# Patient Record
Sex: Female | Born: 1955 | Race: White | Hispanic: No | Marital: Married | Smoking: Never smoker
Health system: Southern US, Community
[De-identification: ages and names within clinical notes are randomized; demographics above are authoritative.]

## PROBLEM LIST (undated history)

## (undated) DIAGNOSIS — N6019 Diffuse cystic mastopathy of unspecified breast: Secondary | ICD-10-CM

## (undated) DIAGNOSIS — K5792 Diverticulitis of intestine, part unspecified, without perforation or abscess without bleeding: Secondary | ICD-10-CM

## (undated) DIAGNOSIS — Z1211 Encounter for screening for malignant neoplasm of colon: Secondary | ICD-10-CM

## (undated) DIAGNOSIS — K5900 Constipation, unspecified: Secondary | ICD-10-CM

## (undated) DIAGNOSIS — Z1239 Encounter for other screening for malignant neoplasm of breast: Secondary | ICD-10-CM

## (undated) HISTORY — DX: Diffuse cystic mastopathy of unspecified breast: N60.19

## (undated) HISTORY — PX: BREAST EXCISIONAL BIOPSY: SUR124

## (undated) HISTORY — DX: Encounter for other screening for malignant neoplasm of breast: Z12.39

## (undated) HISTORY — DX: Constipation, unspecified: K59.00

## (undated) HISTORY — DX: Encounter for screening for malignant neoplasm of colon: Z12.11

## (undated) HISTORY — DX: Diverticulitis of intestine, part unspecified, without perforation or abscess without bleeding: K57.92

---

## 2000-08-30 ENCOUNTER — Other Ambulatory Visit: Admission: RE | Admit: 2000-08-30 | Discharge: 2000-08-30 | Payer: Self-pay | Admitting: Family Medicine

## 2003-02-10 HISTORY — PX: TOE SURGERY: SHX1073

## 2004-04-16 ENCOUNTER — Ambulatory Visit: Payer: Self-pay | Admitting: General Surgery

## 2005-06-01 ENCOUNTER — Ambulatory Visit: Payer: Self-pay | Admitting: General Surgery

## 2005-06-15 ENCOUNTER — Ambulatory Visit: Payer: Self-pay | Admitting: General Surgery

## 2006-02-09 HISTORY — PX: BUNIONECTOMY: SHX129

## 2006-06-17 ENCOUNTER — Ambulatory Visit: Payer: Self-pay | Admitting: General Surgery

## 2007-02-10 HISTORY — PX: COLONOSCOPY: SHX174

## 2007-02-10 LAB — HM COLONOSCOPY

## 2007-04-29 ENCOUNTER — Ambulatory Visit: Payer: Self-pay | Admitting: Family Medicine

## 2007-06-20 ENCOUNTER — Ambulatory Visit: Payer: Self-pay | Admitting: General Surgery

## 2007-09-16 ENCOUNTER — Ambulatory Visit: Payer: Self-pay | Admitting: General Surgery

## 2008-06-25 ENCOUNTER — Ambulatory Visit: Payer: Self-pay | Admitting: General Surgery

## 2009-06-26 ENCOUNTER — Ambulatory Visit: Payer: Self-pay | Admitting: General Surgery

## 2010-02-09 DIAGNOSIS — N6019 Diffuse cystic mastopathy of unspecified breast: Secondary | ICD-10-CM

## 2010-02-09 HISTORY — DX: Diffuse cystic mastopathy of unspecified breast: N60.19

## 2010-06-30 ENCOUNTER — Ambulatory Visit: Payer: Self-pay | Admitting: General Surgery

## 2011-02-10 DIAGNOSIS — Z1211 Encounter for screening for malignant neoplasm of colon: Secondary | ICD-10-CM

## 2011-02-10 DIAGNOSIS — Z1239 Encounter for other screening for malignant neoplasm of breast: Secondary | ICD-10-CM

## 2011-02-10 HISTORY — DX: Encounter for screening for malignant neoplasm of colon: Z12.11

## 2011-02-10 HISTORY — DX: Encounter for other screening for malignant neoplasm of breast: Z12.39

## 2011-07-01 ENCOUNTER — Ambulatory Visit: Payer: Self-pay | Admitting: General Surgery

## 2011-07-09 ENCOUNTER — Ambulatory Visit: Payer: Self-pay | Admitting: General Surgery

## 2011-08-03 ENCOUNTER — Other Ambulatory Visit: Payer: Self-pay | Admitting: Neurosurgery

## 2011-08-03 DIAGNOSIS — M545 Low back pain: Secondary | ICD-10-CM

## 2011-08-17 ENCOUNTER — Ambulatory Visit
Admission: RE | Admit: 2011-08-17 | Discharge: 2011-08-17 | Disposition: A | Discharge status: Home / Self Care | Payer: BC Managed Care – PPO | Source: Ambulatory Visit | Attending: Neurosurgery | Admitting: Neurosurgery

## 2011-08-17 DIAGNOSIS — M545 Low back pain: Secondary | ICD-10-CM

## 2012-02-10 LAB — HM PAP SMEAR

## 2012-04-26 ENCOUNTER — Encounter: Payer: Self-pay | Admitting: *Deleted

## 2012-05-11 ENCOUNTER — Encounter: Payer: Self-pay | Admitting: General Surgery

## 2012-07-01 ENCOUNTER — Ambulatory Visit: Payer: Self-pay | Admitting: General Surgery

## 2012-07-06 ENCOUNTER — Encounter: Payer: Self-pay | Admitting: General Surgery

## 2012-07-18 ENCOUNTER — Encounter: Payer: Self-pay | Admitting: General Surgery

## 2012-07-18 ENCOUNTER — Ambulatory Visit (INDEPENDENT_AMBULATORY_CARE_PROVIDER_SITE_OTHER): Payer: BC Managed Care – PPO | Admitting: General Surgery

## 2012-07-18 VITALS — BP 122/72 | HR 76 | Resp 12 | Ht 65.0 in | Wt 127.0 lb

## 2012-07-18 DIAGNOSIS — N6019 Diffuse cystic mastopathy of unspecified breast: Secondary | ICD-10-CM

## 2012-07-18 DIAGNOSIS — Z1239 Encounter for other screening for malignant neoplasm of breast: Secondary | ICD-10-CM

## 2012-07-18 NOTE — Progress Notes (Signed)
Patient ID: Laurie Figueroa, female   DOB: [birth year], 56 y.o.   MRN: [id number]  No chief complaint on file.   HPI Laurie Figueroa is a 57 y.o. female here for breast f/u with screening mammogram - [facility] 5/23 Cat 2. Patient denies any breast issues. History of FCD with prior biopsies HPI  Past Medical History  Diagnosis Date  . Diffuse cystic mastopathy 2012  . Breast screening, unspecified 2013  . Special screening for malignant neoplasms, colon 2013    Past Surgical History  Procedure Laterality Date  . Colonoscopy  2009    Dr. Evette Cristal  . Bunionectomy  2008  . Toe surgery  2005    Family History  Problem Relation Age of Onset  . Cancer Mother     pancreatic cancer  . Cancer Maternal Aunt     breast cancer    Social History History  Substance Use Topics  . Smoking status: Never Smoker   . Smokeless tobacco: Never Used  . Alcohol Use: 0.0 oz/week     Comment: ocassionally    Allergies  Allergen Reactions  . Codeine Nausea Only  . Penicillins Rash  . Sulfa Antibiotics Rash    No current outpatient prescriptions on file.   No current facility-administered medications for this visit.    Review of Systems Review of Systems  Constitutional: Negative.   Respiratory: Negative.   Cardiovascular: Negative.     Blood pressure 122/72, pulse 76, resp. rate 12, height 5\' 5"  (1.651 m), weight 127 lb (57.607 kg).  Physical Exam Physical Exam  Constitutional: She is oriented to person, place, and time. She appears well-developed and well-nourished.  Eyes: Conjunctivae are normal. No scleral icterus.  Neck: Normal range of motion. Neck supple.  Cardiovascular: Normal rate and regular rhythm.   Pulmonary/Chest: Effort normal and breath sounds normal. Right breast exhibits no inverted nipple, no mass, no nipple discharge, no skin change and no tenderness. Left breast exhibits no inverted nipple, no mass, no nipple discharge, no skin change and no tenderness.  Abdominal:  Soft. Bowel sounds are normal.  Lymphadenopathy:    She has no cervical adenopathy.    She has no axillary adenopathy.  Neurological: She is alert and oriented to person, place, and time.    Data Reviewed Mammogram reviewed Bir[facility] 2  Assessment    Stable exam, hx of FCD    Plan    Return 1 year.  Screening mammogram        Laurie Figueroa 07/18/2012, 9:14 PM

## 2012-07-18 NOTE — Patient Instructions (Signed)
Return in one year, screening mammogram.

## 2013-01-11 ENCOUNTER — Ambulatory Visit: Payer: Self-pay | Admitting: Family Medicine

## 2013-01-30 ENCOUNTER — Encounter: Payer: Self-pay | Admitting: General Surgery

## 2013-01-30 ENCOUNTER — Ambulatory Visit (INDEPENDENT_AMBULATORY_CARE_PROVIDER_SITE_OTHER): Payer: BC Managed Care – PPO | Admitting: General Surgery

## 2013-01-30 VITALS — BP 120/68 | HR 70 | Resp 12 | Ht 65.0 in | Wt 131.0 lb

## 2013-01-30 DIAGNOSIS — M940 Chondrocostal junction syndrome [Tietze]: Secondary | ICD-10-CM

## 2013-01-30 NOTE — Progress Notes (Signed)
Patient ID: Laurie Figueroa, female   DOB: 05/26/1955, 57 y.o.   MRN: [id number]  Chief Complaint  Patient presents with  . Follow-up    BREAST PAIN    HPI Laurie Figueroa is a 57 y.o. female here today for evaluation left breast pain. She states this has been going on three weeks. She states the area is sore and tender.  Last mammogram was in May 2014. She states she had a chest x-ray on December 3 and blood work on December 4.   HPI  Past Medical History  Diagnosis Date  . Diffuse cystic mastopathy 2012  . Breast screening, unspecified 2013  . Special screening for malignant neoplasms, colon 2013    Past Surgical History  Procedure Laterality Date  . Colonoscopy  2009    Dr. Evette Cristal  . Bunionectomy  2008  . Toe surgery  2005    Family History  Problem Relation Age of Onset  . Cancer Mother     pancreatic cancer  . Cancer Maternal Aunt     breast cancer    Social History History  Substance Use Topics  . Smoking status: Never Smoker   . Smokeless tobacco: Never Used  . Alcohol Use: 0.0 oz/week     Comment: ocassionally    Allergies  Allergen Reactions  . Codeine Nausea Only  . Penicillins Rash  . Sulfa Antibiotics Rash    Current Outpatient Prescriptions  Medication Sig Dispense Refill  . omeprazole (PRILOSEC) 20 MG capsule Take 1 capsule by mouth daily.       No current facility-administered medications for this visit.    Review of Systems Review of Systems  Constitutional: Negative.   Respiratory: Negative.   Cardiovascular: Negative.     Blood pressure 120/68, pulse 70, resp. rate 12, height 5\' 5"  (1.651 m), weight 131 lb (59.421 kg).  Physical Exam Physical Exam  Constitutional: She is oriented to person, place, and time. She appears well-developed and well-nourished.  Pulmonary/Chest: Left breast exhibits tenderness ( located over the second costal cartilage). Left breast exhibits no inverted nipple, no mass, no nipple discharge and no skin  change.  Neurological: She is alert and oriented to person, place, and time.  Skin: Skin is warm and dry.    Data Reviewed none  Assessment    Possible costalchondritis    Plan   Patient is aware to use an antiinflammatory of choice (Advil or Aleve)   daily for a week.          Caliann Leckrone G 01/31/2013, 5:36 AM

## 2013-01-30 NOTE — Patient Instructions (Addendum)
The patient is aware to use an antiinflammatory of choice (Advil or Aleve)  2 daily for a week.

## 2013-01-31 ENCOUNTER — Encounter: Payer: Self-pay | Admitting: General Surgery

## 2013-01-31 DIAGNOSIS — D239 Other benign neoplasm of skin, unspecified: Secondary | ICD-10-CM

## 2013-01-31 HISTORY — DX: Other benign neoplasm of skin, unspecified: D23.9

## 2013-08-01 ENCOUNTER — Encounter: Payer: Self-pay | Admitting: General Surgery

## 2013-08-01 ENCOUNTER — Ambulatory Visit: Payer: Self-pay | Admitting: General Surgery

## 2013-08-07 ENCOUNTER — Ambulatory Visit: Payer: BC Managed Care – PPO | Admitting: General Surgery

## 2013-08-28 ENCOUNTER — Ambulatory Visit (INDEPENDENT_AMBULATORY_CARE_PROVIDER_SITE_OTHER): Payer: BC Managed Care – PPO | Admitting: General Surgery

## 2013-08-28 ENCOUNTER — Encounter: Payer: Self-pay | Admitting: General Surgery

## 2013-08-28 VITALS — BP 114/80 | HR 78 | Resp 14 | Ht 65.0 in | Wt 133.0 lb

## 2013-08-28 DIAGNOSIS — Z1239 Encounter for other screening for malignant neoplasm of breast: Secondary | ICD-10-CM

## 2013-08-28 NOTE — Progress Notes (Signed)
Patient ID: Laurie Figueroa, female   DOB: [birth year], 58 y.o.   MRN: [id number]  Chief Complaint  Patient presents with  . Follow-up    mammogram    HPI Laurie Figueroa is a 58 y.o. female who presents for a breast evaluation. The most recent mammogram was done on 07/31/13. Patient does perform regular self breast checks and gets regular mammograms done.  No complaints at this time.   HPI  Past Medical History  Diagnosis Date  . Diffuse cystic mastopathy 2012  . Breast screening, unspecified 2013  . Special screening for malignant neoplasms, colon 2013    Past Surgical History  Procedure Laterality Date  . Colonoscopy  2009    Dr. Jamal Collin  . Bunionectomy  2008  . Toe surgery  2005    Family History  Problem Relation Age of Onset  . Cancer Mother     pancreatic cancer  . Cancer Maternal Aunt     breast cancer    Social History History  Substance Use Topics  . Smoking status: Never Smoker   . Smokeless tobacco: Never Used  . Alcohol Use: 0.0 oz/week     Comment: ocassionally    Allergies  Allergen Reactions  . Codeine Nausea Only  . Penicillins Rash  . Sulfa Antibiotics Rash    No current outpatient prescriptions on file.   No current facility-administered medications for this visit.    Review of Systems Review of Systems  Constitutional: Negative.   Respiratory: Negative.   Cardiovascular: Negative.     Blood pressure 114/80, pulse 78, resp. rate 14, height 5\' 5"  (1.651 m), weight 133 lb (60.328 kg).  Physical Exam Physical Exam  Constitutional: She is oriented to person, place, and time. She appears well-developed and well-nourished.  Eyes: Conjunctivae are normal. No scleral icterus.  Neck: Neck supple. No thyromegaly present.  Cardiovascular: Normal rate, regular rhythm and normal heart sounds.   No murmur heard. Pulmonary/Chest: Effort normal and breath sounds normal. Right breast exhibits no inverted nipple, no mass, no nipple discharge, no  skin change and no tenderness. Left breast exhibits no inverted nipple, no mass, no nipple discharge, no skin change and no tenderness.  Abdominal: Soft. Normal appearance and bowel sounds are normal. There is no hepatosplenomegaly. There is no tenderness. No hernia.  Lymphadenopathy:    She has no cervical adenopathy.    She has no axillary adenopathy.  Neurological: She is alert and oriented to person, place, and time.  Skin: Skin is warm and dry.    Data Reviewed Mammogram reviewed and stable  Assessment    Stable breast exam. History of FCD. Remote FH of breast cancer.    Plan    Patient to return in 1 year with bilateral screening mammogram.        Junie Panning G 08/29/2013, 5:48 AM

## 2013-08-28 NOTE — Patient Instructions (Addendum)
Patient to return in 1 year with bilateral screening mammogram. Continue self breast exams. Call office for any new breast issues or concerns.  

## 2013-08-29 ENCOUNTER — Encounter: Payer: Self-pay | Admitting: General Surgery

## 2013-12-11 ENCOUNTER — Encounter: Payer: Self-pay | Admitting: General Surgery

## 2014-06-10 LAB — HM MAMMOGRAPHY

## 2014-06-20 ENCOUNTER — Other Ambulatory Visit: Payer: Self-pay

## 2014-06-20 DIAGNOSIS — Z1231 Encounter for screening mammogram for malignant neoplasm of breast: Secondary | ICD-10-CM

## 2014-08-03 ENCOUNTER — Ambulatory Visit
Admission: RE | Admit: 2014-08-03 | Discharge: 2014-08-03 | Disposition: A | Discharge status: Home / Self Care | Payer: BC Managed Care – PPO | Source: Ambulatory Visit | Attending: General Surgery | Admitting: General Surgery

## 2014-08-03 DIAGNOSIS — Z1231 Encounter for screening mammogram for malignant neoplasm of breast: Secondary | ICD-10-CM | POA: Insufficient documentation

## 2014-08-21 ENCOUNTER — Encounter: Payer: Self-pay | Admitting: General Surgery

## 2014-08-21 ENCOUNTER — Ambulatory Visit (INDEPENDENT_AMBULATORY_CARE_PROVIDER_SITE_OTHER): Payer: BC Managed Care – PPO | Admitting: General Surgery

## 2014-08-21 VITALS — BP 132/70 | HR 74 | Resp 16 | Ht 65.0 in | Wt 136.0 lb

## 2014-08-21 DIAGNOSIS — Z1239 Encounter for other screening for malignant neoplasm of breast: Secondary | ICD-10-CM | POA: Diagnosis not present

## 2014-08-21 NOTE — Patient Instructions (Addendum)
Patient will be asked to return to the office in one year with a bilateral screening mammogram.  Continue monthly self breast exam. Call for any changes or concerns.

## 2014-08-21 NOTE — Progress Notes (Signed)
Patient ID: Laurie Figueroa, female   DOB: 05-Feb-1956, 59 y.o.   MRN: [id number]  Chief Complaint  Patient presents with  . Follow-up    mammogram    HPI Laurie Figueroa is a 59 y.o. female who presents for a breast evaluation. The most recent mammogram was done on 08/03/14.  Patient does perform regular self breast checks and gets regular mammograms done.    HPI  Past Medical History  Diagnosis Date  . Diffuse cystic mastopathy 2012  . Breast screening, unspecified 2013  . Special screening for malignant neoplasms, colon 2013    Past Surgical History  Procedure Laterality Date  . Colonoscopy  2009    Dr. Jamal Collin  . Bunionectomy  2008  . Toe surgery  2005  . Breast biopsy Left     benign    Family History  Problem Relation Age of Onset  . Cancer Mother     pancreatic cancer  . Breast cancer Maternal Aunt     Social History History  Substance Use Topics  . Smoking status: Never Smoker   . Smokeless tobacco: Never Used  . Alcohol Use: 0.0 oz/week     Comment: ocassionally    Allergies  Allergen Reactions  . Codeine Nausea Only  . Penicillins Rash  . Sulfa Antibiotics Rash    No current outpatient prescriptions on file.   No current facility-administered medications for this visit.    Review of Systems Review of Systems  Constitutional: Negative.   Respiratory: Negative.   Cardiovascular: Negative.     Blood pressure 132/70, pulse 74, resp. rate 16, height 5\' 5"  (1.651 m), weight 136 lb (61.689 kg).  Physical Exam Physical Exam  Constitutional: She is oriented to person, place, and time. She appears well-developed and well-nourished.  Eyes: Conjunctivae are normal. No scleral icterus.  Neck: Neck supple.  Cardiovascular: Normal rate, regular rhythm and normal heart sounds.   Pulmonary/Chest: Effort normal and breath sounds normal. Right breast exhibits no inverted nipple, no mass, no nipple discharge, no skin change and no tenderness. Left breast  exhibits no inverted nipple, no mass, no nipple discharge, no skin change and no tenderness.  Abdominal: Soft. Normal appearance and bowel sounds are normal. There is no hepatomegaly. There is no tenderness.  Lymphadenopathy:    She has no cervical adenopathy.    She has no axillary adenopathy.  Neurological: She is alert and oriented to person, place, and time.  Skin: Skin is warm and dry.    Data Reviewed Mammogram reviewed and stable.   Assessment History of FCD. Remote FH of breast cancer.     Plan   She has not had a full physical nor a gyn eval in few yrs. Discussed with pt and shis agreeable to seeing a gynecologist.  Patient will be asked to return to the office in one year with a bilateral screening mammogram.     PCP:  Cranford Mon, [place] Breeding G 08/21/2014, 1:50 PM

## 2014-10-11 ENCOUNTER — Ambulatory Visit (INDEPENDENT_AMBULATORY_CARE_PROVIDER_SITE_OTHER): Payer: BC Managed Care – PPO | Admitting: Obstetrics and Gynecology

## 2014-10-11 ENCOUNTER — Encounter: Payer: Self-pay | Admitting: Obstetrics and Gynecology

## 2014-10-11 VITALS — BP 133/83 | HR 76 | Ht 65.0 in | Wt 133.3 lb

## 2014-10-11 DIAGNOSIS — Z8619 Personal history of other infectious and parasitic diseases: Secondary | ICD-10-CM | POA: Diagnosis not present

## 2014-10-11 DIAGNOSIS — Z1211 Encounter for screening for malignant neoplasm of colon: Secondary | ICD-10-CM | POA: Diagnosis not present

## 2014-10-11 DIAGNOSIS — K219 Gastro-esophageal reflux disease without esophagitis: Secondary | ICD-10-CM | POA: Insufficient documentation

## 2014-10-11 DIAGNOSIS — N952 Postmenopausal atrophic vaginitis: Secondary | ICD-10-CM | POA: Diagnosis not present

## 2014-10-11 DIAGNOSIS — Z01419 Encounter for gynecological examination (general) (routine) without abnormal findings: Secondary | ICD-10-CM | POA: Diagnosis not present

## 2014-10-11 DIAGNOSIS — Z780 Asymptomatic menopausal state: Secondary | ICD-10-CM | POA: Diagnosis not present

## 2014-10-11 NOTE — Patient Instructions (Signed)
1.  Pap smear completed. 2.  Mammograms to be done per Dr. Jamal Collin. 3.  Stool guaiac card testing ordered. 4.  Routine screening labs ordered 5.  Encouraged calcium and vitamin D supplementation daily along with weightbearing exercise. 6.  Return in 1 year.

## 2014-10-11 NOTE — Progress Notes (Signed)
Patient ID: Laurie Figueroa, female   DOB: May 05, 1955, 59 y.o.   MRN: [id number] ANNUAL PREVENTATIVE CARE GYN  ENCOUNTER NOTE  Subjective:       Laurie Figueroa is a 59 y.o. G37P3003 female here for a routine annual gynecologic exam.  Current complaints: 1.  none    Gynecologic History No LMP recorded. Patient is postmenopausal. Contraception: post menopausal status Last Pap: 2014 . Results were: normal Last mammogram: 06/2014. Results were: normal No history of abnormal Pap smears. No history of gynecologic surgery. No HRT use. No significant vasomotor symptoms. Mild vaginal atrophy symptoms.   Obstetric History OB History  Gravida Para Term Preterm AB SAB TAB Ectopic Multiple Living  3 3 3       3     # Outcome Date GA Lbr Len/2nd Weight Sex Delivery Anes PTL Lv  3 Term 1994   7 lb 1.8 oz (3.225 kg) F Vag-Spont     2 Term 1991   7 lb 2.3 oz (3.239 kg) F Vag-Spont     1 Term 1988   7 lb 6.4 oz (3.357 kg) M Vag-Spont       Obstetric Comments  Age at first pregnancy-31  Age at first menstruation-12  Age at last period-53    Past Medical History  Diagnosis Date  . Diffuse cystic mastopathy 2012  . Breast screening, unspecified 2013  . Special screening for malignant neoplasms, colon 2013  . Constipation     Past Surgical History  Procedure Laterality Date  . Colonoscopy  2009    Dr. Jamal Collin  . Bunionectomy  2008  . Toe surgery  2005  . Breast biopsy Left     benign    No current outpatient prescriptions on file prior to visit.   No current facility-administered medications on file prior to visit.    Allergies  Allergen Reactions  . Codeine Nausea Only  . Penicillins Rash  . Sulfa Antibiotics Rash    Social History   Social History  . Marital Status: Married    Spouse Name: N/A  . Number of Children: N/A  . Years of Education: N/A   Occupational History  . Not on file.   Social History Main Topics  . Smoking status: Never Smoker   . Smokeless  tobacco: Never Used  . Alcohol Use: 0.0 oz/week     Comment: ocassionally  . Drug Use: No  . Sexual Activity: Yes    Birth Control/ Protection: Post-menopausal   Other Topics Concern  . Not on file   Social History Narrative    Family History  Problem Relation Age of Onset  . Cancer Mother     pancreatic cancer  . Breast cancer Maternal Aunt   . Diabetes Brother   . Heart disease Maternal Grandmother   . Ovarian cancer Neg Hx   . Colon cancer Neg Hx     The following portions of the patient's history were reviewed and updated as appropriate: allergies, current medications, past family history, past medical history, past social history, past surgical history and problem list.  Review of Systems ROS Review of Systems - General ROS: negative for - chills, fatigue, fever, hot flashes, night sweats, weight gain or weight loss Psychological ROS: negative for - anxiety, decreased libido, depression, mood swings, physical abuse or sexual abuse Ophthalmic ROS: negative for - blurry vision, eye pain or loss of vision ENT ROS: negative for - headaches, hearing change, visual changes or vocal changes Allergy and Immunology  ROS: negative for - hives, itchy/watery eyes or seasonal allergies Hematological and Lymphatic ROS: negative for - bleeding problems, bruising, swollen lymph nodes or weight loss Endocrine ROS: negative for - galactorrhea, hair pattern changes, hot flashes, malaise/lethargy, mood swings, palpitations, polydipsia/polyuria, skin changes, temperature intolerance or unexpected weight changes Breast ROS: negative for - new or changing breast lumps or nipple discharge Respiratory ROS: negative for - cough or shortness of breath Cardiovascular ROS: negative for - chest pain, irregular heartbeat, palpitations or shortness of breath Gastrointestinal ROS: no abdominal pain, change in bowel habits, or black or bloody stools Genito-Urinary ROS: no dysuria, trouble voiding, or  hematuria Musculoskeletal ROS: negative for - joint pain or joint stiffness Neurological ROS: negative for - bowel and bladder control changes Dermatological ROS: negative for rash and skin lesion changes   Objective:   BP 133/83 mmHg  Pulse 76  Ht 5\' 5"  (1.651 m)  Wt 133 lb 4.8 oz (60.464 kg)  BMI 22.18 kg/m2 CONSTITUTIONAL: Well-developed, well-nourished female in no acute distress.  PSYCHIATRIC: Normal mood and affect. Normal behavior. Normal judgment and thought content. [place]: Alert and oriented to person, place, and time. Normal muscle tone coordination. No cranial nerve deficit noted. HENT:  Normocephalic, atraumatic, External right and left ear normal. Oropharynx is clear and moist EYES: Conjunctivae and EOM are normal. Pupils are equal, round, and reactive to light. No scleral icterus.  NECK: Normal range of motion, supple, no masses.  Normal thyroid.  SKIN: Skin is warm and dry. No rash noted. Not diaphoretic. No erythema. No pallor. CARDIOVASCULAR: Normal heart rate noted, regular rhythm, no murmur. RESPIRATORY: Clear to auscultation bilaterally. Effort and breath sounds normal, no problems with respiration noted. BREASTS: Symmetric in size. No masses, skin changes, nipple drainage, or lymphadenopathy. ABDOMEN: Soft, normal bowel sounds, no distention noted.  No tenderness, rebound or guarding.  BLADDER: Normal PELVIC:  External Genitalia: Normal  BUS: Normal  Vagina: Normal  Cervix: Normal  Uterus: Normal  Adnexa: Normal  RV: External Exam NormaI, No Rectal Masses and Normal Sphincter tone  MUSCULOSKELETAL: Normal range of motion. No tenderness.  No cyanosis, clubbing, or edema.  2+ distal pulses. LYMPHATIC: No Axillary, Supraclavicular, or Inguinal Adenopathy.    Assessment:   Annual gynecologic examination 59 y.o. Contraception: post menopausal status Normal BMI Menopause. Vaginal atrophy  Plan:  Pap: Pap Co Test Mammogram: Not Indicated Stool Guaiac  Testing:  Ordered Labs: lipid, vit d, a1c, fbs. tsh Routine preventative health maintenance measures emphasized: Exercise/Diet/Weight control, Tobacco Warnings and Alcohol/Substance use risks Lubricants for vaginal atrophy were discussed. Return to [place], [place] Brayton Mars, MD

## 2014-10-12 ENCOUNTER — Telehealth: Payer: Self-pay

## 2014-10-12 LAB — HEMOGLOBIN A1C
Est. average glucose Bld gHb Est-mCnc: 108 mg/dL
Hgb A1c MFr Bld: 5.4 % (ref 4.8–5.6)

## 2014-10-12 LAB — LIPID PANEL
CHOLESTEROL TOTAL: 210 mg/dL — AB (ref 100–199)
Chol/HDL Ratio: 2.1 ratio units (ref 0.0–4.4)
HDL: 99 mg/dL (ref >39)
LDL CALC: 101 mg/dL — AB (ref 0–99)
TRIGLYCERIDES: 50 mg/dL (ref 0–149)
VLDL Cholesterol Cal: 10 mg/dL (ref 5–40)

## 2014-10-12 LAB — VITAMIN D 25 HYDROXY (VIT D DEFICIENCY, FRACTURES): Vit D, 25-Hydroxy: 31.9 ng/mL (ref 30.0–100.0)

## 2014-10-12 LAB — GLUCOSE, RANDOM: GLUCOSE: 79 mg/dL (ref 65–99)

## 2014-10-12 LAB — TSH: TSH: 0.778 u[IU]/mL (ref 0.450–4.500)

## 2014-10-12 NOTE — Telephone Encounter (Signed)
-----   Message from Brayton Mars, MD sent at 10/12/2014  8:45 AM EDT ----- Please Notify - Labs normal Cholesterol panel borderline. Recheck in 1 year.

## 2014-10-12 NOTE — Telephone Encounter (Signed)
Pt notified that labs were normal except cholesterol panel borderline and recheck in 1 yr. Pt inquired about pap results and pt informed that those results take 7-10 days or maybe less and they are not back yet. Pt encouraged to eat healthy and exercise.

## 2014-10-16 LAB — PAP IG AND HPV HIGH-RISK: PAP SMEAR COMMENT: 0

## 2014-11-14 ENCOUNTER — Ambulatory Visit: Payer: BC Managed Care – PPO

## 2014-11-14 ENCOUNTER — Ambulatory Visit (INDEPENDENT_AMBULATORY_CARE_PROVIDER_SITE_OTHER): Payer: BC Managed Care – PPO

## 2014-11-14 ENCOUNTER — Encounter: Payer: Self-pay | Admitting: Podiatry

## 2014-11-14 ENCOUNTER — Ambulatory Visit (INDEPENDENT_AMBULATORY_CARE_PROVIDER_SITE_OTHER): Payer: BC Managed Care – PPO | Admitting: Podiatry

## 2014-11-14 VITALS — BP 141/84 | HR 73 | Resp 18

## 2014-11-14 DIAGNOSIS — R52 Pain, unspecified: Secondary | ICD-10-CM | POA: Diagnosis not present

## 2014-11-14 DIAGNOSIS — M722 Plantar fascial fibromatosis: Secondary | ICD-10-CM

## 2014-11-14 DIAGNOSIS — M2011 Hallux valgus (acquired), right foot: Secondary | ICD-10-CM

## 2014-11-14 DIAGNOSIS — M2012 Hallux valgus (acquired), left foot: Secondary | ICD-10-CM

## 2014-11-14 MED ORDER — METHYLPREDNISOLONE 4 MG PO TBPK: ORAL_TABLET | ORAL | Status: DC

## 2014-11-14 MED ORDER — MELOXICAM 15 MG PO TABS: 15.0000 mg | ORAL_TABLET | Freq: Every day | ORAL | Status: DC

## 2014-11-14 NOTE — Patient Instructions (Signed)
Pre-Operative Instructions  Congratulations, you have decided to take an important step to improving your quality of life.  You can be assured that the doctors of [facility] will be with you every step of the way.  1. Plan to be at the surgery center/hospital at least 1 (one) hour prior to your scheduled time unless otherwise directed by the surgical center/hospital staff.  You must have a responsible adult accompany you, remain during the surgery and drive you home.  Make sure you have directions to the surgical center/hospital and know how to get there on time. 2. For hospital based surgery you will need to obtain a history and physical form from your family physician within 1 month prior to the date of surgery- we will give you a form for you primary physician.  3. We make every effort to accommodate the date you request for surgery.  There are however, times where surgery dates or times have to be moved.  We will contact you as soon as possible if a change in schedule is required.   4. No Aspirin/Ibuprofen for one week before surgery.  If you are on aspirin, any non-steroidal anti-inflammatory medications (Mobic, Aleve, Ibuprofen) you should stop taking it 7 days prior to your surgery.  You make take Tylenol  For pain prior to surgery.  5. Medications- If you are taking daily heart and blood pressure medications, seizure, reflux, allergy, asthma, anxiety, pain or diabetes medications, make sure the surgery center/hospital is aware before the day of surgery so they may notify you which medications to take or avoid the day of surgery. 6. No food or drink after midnight the night before surgery unless directed otherwise by surgical center/hospital staff. 7. No alcoholic beverages 24 hours prior to surgery.  No smoking 24 hours prior to or 24 hours after surgery. 8. Wear loose pants or shorts- loose enough to fit over bandages, boots, and casts. 9. No slip on shoes, sneakers are best. 10. Bring  your boot with you to the surgery center/hospital.  Also bring crutches or a walker if your physician has prescribed it for you.  If you do not have this equipment, it will be provided for you after surgery. 11. If you have not been contracted by the surgery center/hospital by the day before your surgery, call to confirm the date and time of your surgery. 12. Leave-time from work may vary depending on the type of surgery you have.  Appropriate arrangements should be made prior to surgery with your employer. 13. Prescriptions will be provided immediately following surgery by your doctor.  Have these filled as soon as possible after surgery and take the medication as directed. 14. Remove nail polish on the operative foot. 15. Wash the night before surgery.  The night before surgery wash the foot and leg well with the antibacterial soap provided and water paying special attention to beneath the toenails and in between the toes.  Rinse thoroughly with water and dry well with a towel.  Perform this wash unless told not to do so by your physician.  Enclosed: 1 Ice pack (please put in freezer the night before surgery)   1 Hibiclens skin cleaner   Pre-op Instructions  If you have any questions regarding the instructions, do not hesitate to call our office.  [place]: [address] [place], [place] [address] [phone]  [place]: [address] [place], [place] [address] [phone]  [place]: [address]  [place], [place] [address] [phone]  Dr. Richard   Tuchman DPM, Dr. Ila Mcgill DPM Dr. Harriet Masson DPM, Dr. Lanelle Bal DPM, Dr. Trudie Buckler DPM   Plantar Fasciitis (Heel Spur Syndrome) with Rehab The plantar fascia is a fibrous, ligament-like, soft-tissue structure that spans the bottom of the foot. Plantar fasciitis is a condition that causes pain in the foot due to inflammation of the tissue. SYMPTOMS  16. Pain and tenderness on the underneath side of the foot. 17. Pain that  worsens with standing or walking. CAUSES  Plantar fasciitis is caused by irritation and injury to the plantar fascia on the underneath side of the foot. Common mechanisms of injury include:  Direct trauma to bottom of the foot.  Damage to a small nerve that runs under the foot where the main fascia attaches to the heel bone.  Stress placed on the plantar fascia due to bone spurs. RISK INCREASES WITH:   Activities that place stress on the plantar fascia (running, jumping, pivoting, or cutting).  Poor strength and flexibility.  Improperly fitted shoes.  Tight calf muscles.  Flat feet.  Failure to warm-up properly before activity.  Obesity. PREVENTION  Warm up and stretch properly before activity.  Allow for adequate recovery between workouts.  Maintain physical fitness:  Strength, flexibility, and endurance.  Cardiovascular fitness.  Maintain a health body weight.  Avoid stress on the plantar fascia.  Wear properly fitted shoes, including arch supports for individuals who have flat feet.  PROGNOSIS  If treated properly, then the symptoms of plantar fasciitis usually resolve without surgery. However, occasionally surgery is necessary.  RELATED COMPLICATIONS   Recurrent symptoms that may result in a chronic condition.  Problems of the lower back that are caused by compensating for the injury, such as limping.  Pain or weakness of the foot during push-off following surgery.  Chronic inflammation, scarring, and partial or complete fascia tear, occurring more often from repeated injections.  TREATMENT  Treatment initially involves the use of ice and medication to help reduce pain and inflammation. The use of strengthening and stretching exercises may help reduce pain with activity, especially stretches of the Achilles tendon. These exercises may be performed at home or with a therapist. Your caregiver may recommend that you use heel cups of arch supports to help  reduce stress on the plantar fascia. Occasionally, corticosteroid injections are given to reduce inflammation. If symptoms persist for greater than 6 months despite non-surgical (conservative), then surgery may be recommended.   MEDICATION   If pain medication is necessary, then nonsteroidal anti-inflammatory medications, such as aspirin and ibuprofen, or other minor pain relievers, such as acetaminophen, are often recommended.  Do not take pain medication within 7 days before surgery.  Prescription pain relievers may be given if deemed necessary by your caregiver. Use only as directed and only as much as you need.  Corticosteroid injections may be given by your caregiver. These injections should be reserved for the most serious cases, because they may only be given a certain number of times.  HEAT AND COLD  Cold treatment (icing) relieves pain and reduces inflammation. Cold treatment should be applied for 10 to 15 minutes every 2 to 3 hours for inflammation and pain and immediately after any activity that aggravates your symptoms. Use ice packs or massage the area with a piece of ice (ice massage).  Heat treatment may be used prior to performing the stretching and strengthening activities prescribed by your caregiver, physical therapist, or athletic trainer. Use a heat pack or soak the injury in  warm water.  SEEK IMMEDIATE MEDICAL CARE IF:  Treatment seems to offer no benefit, or the condition worsens.  Any medications produce adverse side effects.  EXERCISES- RANGE OF MOTION (ROM) AND STRETCHING EXERCISES - Plantar Fasciitis (Heel Spur Syndrome) These exercises may help you when beginning to rehabilitate your injury. Your symptoms may resolve with or without further involvement from your physician, physical therapist or athletic trainer. While completing these exercises, remember:   Restoring tissue flexibility helps normal motion to return to the joints. This allows healthier, less  painful movement and activity.  An effective stretch should be held for at least 30 seconds.  A stretch should never be painful. You should only feel a gentle lengthening or release in the stretched tissue.  RANGE OF MOTION - Toe Extension, Flexion  Sit with your right / left leg crossed over your opposite knee.  Grasp your toes and gently pull them back toward the top of your foot. You should feel a stretch on the bottom of your toes and/or foot.  Hold this stretch for 10 seconds.  Now, gently pull your toes toward the bottom of your foot. You should feel a stretch on the top of your toes and or foot.  Hold this stretch for 10 seconds. Repeat  times. Complete this stretch 3 times per day.   RANGE OF MOTION - Ankle Dorsiflexion, Active Assisted  Remove shoes and sit on a chair that is preferably not on a carpeted surface.  Place right / left foot under knee. Extend your opposite leg for support.  Keeping your heel down, slide your right / left foot back toward the chair until you feel a stretch at your ankle or calf. If you do not feel a stretch, slide your bottom forward to the edge of the chair, while still keeping your heel down.  Hold this stretch for 10 seconds. Repeat 3 times. Complete this stretch 2 times per day.   STRETCH  Gastroc, Standing  Place hands on wall.  Extend right / left leg, keeping the front knee somewhat bent.  Slightly point your toes inward on your back foot.  Keeping your right / left heel on the floor and your knee straight, shift your weight toward the wall, not allowing your back to arch.  You should feel a gentle stretch in the right / left calf. Hold this position for 10 seconds. Repeat 3 times. Complete this stretch 2 times per day.  STRETCH  Soleus, Standing  Place hands on wall.  Extend right / left leg, keeping the other knee somewhat bent.  Slightly point your toes inward on your back foot.  Keep your right / left heel on the  floor, bend your back knee, and slightly shift your weight over the back leg so that you feel a gentle stretch deep in your back calf.  Hold this position for 10 seconds. Repeat 3 times. Complete this stretch 2 times per day.  STRETCH  Gastrocsoleus, Standing  Note: This exercise can place a lot of stress on your foot and ankle. Please complete this exercise only if specifically instructed by your caregiver.   Place the ball of your right / left foot on a step, keeping your other foot firmly on the same step.  Hold on to the wall or a rail for balance.  Slowly lift your other foot, allowing your body weight to press your heel down over the edge of the step.  You should feel a stretch in your right /  left calf.  Hold this position for 10 seconds.  Repeat this exercise with a slight bend in your right / left knee. Repeat 3 times. Complete this stretch 2 times per day.   STRENGTHENING EXERCISES - Plantar Fasciitis (Heel Spur Syndrome)  These exercises may help you when beginning to rehabilitate your injury. They may resolve your symptoms with or without further involvement from your physician, physical therapist or athletic trainer. While completing these exercises, remember:   Muscles can gain both the endurance and the strength needed for everyday activities through controlled exercises.  Complete these exercises as instructed by your physician, physical therapist or athletic trainer. Progress the resistance and repetitions only as guided.  STRENGTH - Towel Curls  Sit in a chair positioned on a non-carpeted surface.  Place your foot on a towel, keeping your heel on the floor.  Pull the towel toward your heel by only curling your toes. Keep your heel on the floor. Repeat 3 times. Complete this exercise 2 times per day.  STRENGTH - Ankle Inversion  Secure one end of a rubber exercise band/tubing to a fixed object (table, pole). Loop the other end around your foot just before your  toes.  Place your fists between your knees. This will focus your strengthening at your ankle.  Slowly, pull your big toe up and in, making sure the band/tubing is positioned to resist the entire motion.  Hold this position for 10 seconds.  Have your muscles resist the band/tubing as it slowly pulls your foot back to the starting position. Repeat 3 times. Complete this exercises 2 times per day.  Document Released: 01/26/2005 Document Revised: 04/20/2011 Document Reviewed: 05/10/2008 [facility] Patient Information 2014 [place], [place].

## 2014-11-14 NOTE — Progress Notes (Signed)
She presents today with chief complaint of pain to her right heel 3 weeks. She says nothing that she has done seems to help alleviate those symptoms. She is concerned that her bunion deformity is leading to this heel pain. She also states that she has some tenderness overlying the first metatarsal of the left foot where a previous bunion was performed thinks it is the K wires. She states it bothers her with shoe gear. She also complains of the bunion to the right foot being painful with shoe gear and starts to limit her daily activities. She's done nothing to try to relieve her symptoms.  Objective: I have reviewed her past mental history medications allergy surgeries and social history. 59 year old female in no apparent distress vital signs stable alert and oriented 3 strong palpable pulses bilateral. Neurologic sensorium is intact percent lasting monofilament. Deep tendon reflexes are intact bilateral and muscle strength +5 over 5 dorsiflexors plantar flexors inverters and everters all in his musculature is intact. Orthopedic evaluation does demonstrate hallux abductovalgus deformity of the right foot with full range of motion and crepitation and no pain on range of motion. She does have a reducible first intermetatarsal angle which would be good for surgical consideration. She also has a slight contraction of the second digit at the level of the metatarsophalangeal joint right foot. She has pain on palpation medial calcaneal tubercle of the right heel with no pain on medial and lateral compression of the calcaneus. She also has a palpable K wire to the dorsal and dorsomedial aspect of the first metatarsal left foot. 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 Contracture and near overlapping of the second digit at the metatarsophalangeal joint  is present. She also has a small plantar distally oriented calcaneal heel spur to the right heel with soft-tissue increase in density at the plantar fascial calcaneal insertion site. Left foot does demonstrate 2 retained K wires with a rectus first metatarsophalangeal joint. Cutaneous evaluation demonstrates supple well-hydrated cutis no erythema edema saline as drainage or odor.  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 Contracted second metatarsophalangeal joint of the right foot. Plantar fasciitis right foot. Painfully retained K wires first metatarsal left foot.  Plan: We discussed the etiology pathology conservative versus surgical therapies. At this point I injected her right heel today with Kenalog and local and aesthetic placed her plantar fascial strap and dispensed a plantar fascial night splint. I also dispensed a prescription for Medrol Dosepak to be followed by meloxicam. We also consented her today for surgical intervention consisting of an [facility] bunion repair of the right foot with screw fixation. I also consented her for endoscopic plantar fasciotomy of the right foot. We also consented her for removal of internal fixation/K wires first metatarsal left foot. We discussed all of these in great detail today. We discussed the pros and cons of all these procedures and also discussed possible complications which may include but are not limited to postop pain bleeding swelling infection recurrence need for further surgery over correction under correction loss of digit loss of limb and loss of life. She signed all 3 pages of the consent form I will follow-up with her in the near future for surgical intervention.  Laurie Figueroa DPM

## 2014-12-13 ENCOUNTER — Telehealth: Payer: Self-pay | Admitting: *Deleted

## 2014-12-13 NOTE — Telephone Encounter (Signed)
"  Patient called and canceled surgery.  Said she would call back later to reschedule."  Did she say why?  "No, she didn't."

## 2014-12-26 ENCOUNTER — Encounter: Payer: Self-pay | Admitting: Podiatry

## 2015-01-01 ENCOUNTER — Encounter: Payer: Self-pay | Admitting: Podiatry

## 2015-02-10 HISTORY — PX: FOOT SURGERY: SHX648

## 2015-05-02 ENCOUNTER — Ambulatory Visit (INDEPENDENT_AMBULATORY_CARE_PROVIDER_SITE_OTHER): Payer: BC Managed Care – PPO | Admitting: Podiatry

## 2015-05-02 DIAGNOSIS — M7751 Other enthesopathy of right foot: Secondary | ICD-10-CM | POA: Diagnosis not present

## 2015-05-02 DIAGNOSIS — M2011 Hallux valgus (acquired), right foot: Secondary | ICD-10-CM | POA: Diagnosis not present

## 2015-05-02 DIAGNOSIS — M722 Plantar fascial fibromatosis: Secondary | ICD-10-CM | POA: Diagnosis not present

## 2015-05-02 DIAGNOSIS — M779 Enthesopathy, unspecified: Principal | ICD-10-CM

## 2015-05-02 DIAGNOSIS — M778 Other enthesopathies, not elsewhere classified: Secondary | ICD-10-CM

## 2015-05-02 NOTE — Patient Instructions (Signed)
Pre-Operative Instructions  Congratulations, you have decided to take an important step to improving your quality of life.  You can be assured that the doctors of [facility] will be with you every step of the way.  1. Plan to be at the surgery center/hospital at least 1 (one) hour prior to your scheduled time unless otherwise directed by the surgical center/hospital staff.  You must have a responsible adult accompany you, remain during the surgery and drive you home.  Make sure you have directions to the surgical center/hospital and know how to get there on time. 2. For hospital based surgery you will need to obtain a history and physical form from your family physician within 1 month prior to the date of surgery- we will give you a form for you primary physician.  3. We make every effort to accommodate the date you request for surgery.  There are however, times where surgery dates or times have to be moved.  We will contact you as soon as possible if a change in schedule is required.   4. No Aspirin/Ibuprofen for one week before surgery.  If you are on aspirin, any non-steroidal anti-inflammatory medications (Mobic, Aleve, Ibuprofen) you should stop taking it 7 days prior to your surgery.  You make take Tylenol  For pain prior to surgery.  5. Medications- If you are taking daily heart and blood pressure medications, seizure, reflux, allergy, asthma, anxiety, pain or diabetes medications, make sure the surgery center/hospital is aware before the day of surgery so they may notify you which medications to take or avoid the day of surgery. 6. No food or drink after midnight the night before surgery unless directed otherwise by surgical center/hospital staff. 7. No alcoholic beverages 24 hours prior to surgery.  No smoking 24 hours prior to or 24 hours after surgery. 8. Wear loose pants or shorts- loose enough to fit over bandages, boots, and casts. 9. No slip on shoes, sneakers are best. 10. Bring  your boot with you to the surgery center/hospital.  Also bring crutches or a walker if your physician has prescribed it for you.  If you do not have this equipment, it will be provided for you after surgery. 11. If you have not been contracted by the surgery center/hospital by the day before your surgery, call to confirm the date and time of your surgery. 12. Leave-time from work may vary depending on the type of surgery you have.  Appropriate arrangements should be made prior to surgery with your employer. 13. Prescriptions will be provided immediately following surgery by your doctor.  Have these filled as soon as possible after surgery and take the medication as directed. 14. Remove nail polish on the operative foot. 15. Wash the night before surgery.  The night before surgery wash the foot and leg well with the antibacterial soap provided and water paying special attention to beneath the toenails and in between the toes.  Rinse thoroughly with water and dry well with a towel.  Perform this wash unless told not to do so by your physician.  Enclosed: 1 Ice pack (please put in freezer the night before surgery)   1 Hibiclens skin cleaner   Pre-op Instructions  If you have any questions regarding the instructions, do not hesitate to call our office.  [place]: [address] , [place] [address] [phone]  [place]: [address] [place], [place] [address] [phone]  [place]: [address]  [place], [place] [address] [phone]  Dr. Richard   Tuchman DPM, Dr. Norman Regal DPM Dr. Richard Sikora DPM, Dr. M. Todd Hyatt DPM, Dr. Kathryn Egerton DPM 

## 2015-05-03 NOTE — Progress Notes (Signed)
She presents today to re-side her consent regarding her bilateral foot surgery. She also states that she is having some such severe pain to the plantar aspect of the second metatarsophalangeal joint she just needs something to tide her over until she can have surgery in May.  Objective: Vital signs are stable she is alert and oriented 3, pulses are strongly palpable. She has great range of motion of the first metatarsophalangeal joint she has pain on palpation and in range of motion of the second metatarsophalangeal joint with mild hammertoe deformity. We also discussed removing the K wires from the first metatarsal of the left foot.  Assessment: Hallux abductovalgus deformity right plantar flex elongated second metatarsal right foot with hammertoe deformity current capsulitis.  Plan: I injected around the second metatarsophalangeal joint today with Kenalog and local anesthetic and she re-signed her consent forms and we added second metatarsal osteotomy and hammertoe repair which was discussed thoroughly today. I will follow-up with her in May for surgical correction.

## 2015-05-14 ENCOUNTER — Other Ambulatory Visit: Payer: Self-pay | Admitting: *Deleted

## 2015-05-14 DIAGNOSIS — Z1231 Encounter for screening mammogram for malignant neoplasm of breast: Secondary | ICD-10-CM

## 2015-05-16 ENCOUNTER — Encounter: Payer: Self-pay | Admitting: *Deleted

## 2015-07-04 ENCOUNTER — Other Ambulatory Visit: Payer: Self-pay | Admitting: Podiatry

## 2015-07-04 MED ORDER — CLINDAMYCIN HCL 150 MG PO CAPS: 150.0000 mg | ORAL_CAPSULE | Freq: Three times a day (TID) | ORAL | Status: DC

## 2015-07-04 MED ORDER — ONDANSETRON HCL 4 MG PO TABS: 4.0000 mg | ORAL_TABLET | Freq: Three times a day (TID) | ORAL | Status: DC | PRN

## 2015-07-04 MED ORDER — MEPERIDINE HCL 50 MG PO TABS: ORAL_TABLET | ORAL | Status: DC

## 2015-07-05 ENCOUNTER — Encounter: Payer: Self-pay | Admitting: Podiatry

## 2015-07-05 DIAGNOSIS — Z4889 Encounter for other specified surgical aftercare: Secondary | ICD-10-CM | POA: Diagnosis not present

## 2015-07-05 DIAGNOSIS — D492 Neoplasm of unspecified behavior of bone, soft tissue, and skin: Secondary | ICD-10-CM | POA: Diagnosis not present

## 2015-07-05 DIAGNOSIS — M2011 Hallux valgus (acquired), right foot: Secondary | ICD-10-CM | POA: Diagnosis not present

## 2015-07-05 DIAGNOSIS — M21541 Acquired clubfoot, right foot: Secondary | ICD-10-CM | POA: Diagnosis not present

## 2015-07-10 ENCOUNTER — Ambulatory Visit (INDEPENDENT_AMBULATORY_CARE_PROVIDER_SITE_OTHER): Payer: BC Managed Care – PPO

## 2015-07-10 ENCOUNTER — Encounter: Payer: Self-pay | Admitting: Podiatry

## 2015-07-10 ENCOUNTER — Telehealth: Payer: Self-pay | Admitting: *Deleted

## 2015-07-10 ENCOUNTER — Ambulatory Visit (INDEPENDENT_AMBULATORY_CARE_PROVIDER_SITE_OTHER): Payer: BC Managed Care – PPO | Admitting: Podiatry

## 2015-07-10 VITALS — BP 126/80 | HR 92 | Resp 16

## 2015-07-10 DIAGNOSIS — T847XXD Infection and inflammatory reaction due to other internal orthopedic prosthetic devices, implants and grafts, subsequent encounter: Secondary | ICD-10-CM

## 2015-07-10 DIAGNOSIS — M722 Plantar fascial fibromatosis: Secondary | ICD-10-CM | POA: Diagnosis not present

## 2015-07-10 DIAGNOSIS — Z9889 Other specified postprocedural states: Secondary | ICD-10-CM

## 2015-07-10 DIAGNOSIS — M2011 Hallux valgus (acquired), right foot: Secondary | ICD-10-CM

## 2015-07-10 NOTE — Telephone Encounter (Addendum)
Post op courtesy call-Left message on pt's home line, I would try to reach her on her mobile line.  I spoke with pt, she states she is doing well after her 1st POV today with Dr. Milinda Pointer.  I asked pt if she was doing okay with the boot and if she had any questions. Pt states she is fine and will call with concerns.

## 2015-07-10 NOTE — Progress Notes (Signed)
She presents today 6 days status post [facility] bunion repair right foot metatarsal osteotomy right foot and a endoscopic plantar fasciotomy right foot we also removed painful internal fixation first metatarsal left foot. She states that the left foot is fine the right foot is been painful. She is trying not to utilize for Demerol instead she is taking ibuprofen.  Objective: While signs are stable alert and oriented 3. Pulses are palpable. Neurologic sensorium is intact. Dry sterile dressing removed demonstrates mild edema about the right foot sutures are intact R is well coapted she has great range of motion of the first second toes of the right foot. Left foot demonstrates sutures are intact no erythema saline strange odor. Radiographs demonstrate first and second metatarsal osteotomies with screw fixation intact and in good position. No fractures identified.  Assessment: Well-healing surgical foot right and left.  Plan: Redressed the right foot today with a dry sterile compressive dressing. Continue use of the cam walker at all times and will follow up with her in 1 week for suture removal.

## 2015-07-17 ENCOUNTER — Ambulatory Visit (INDEPENDENT_AMBULATORY_CARE_PROVIDER_SITE_OTHER): Payer: BC Managed Care – PPO | Admitting: Podiatry

## 2015-07-17 ENCOUNTER — Encounter: Payer: Self-pay | Admitting: Podiatry

## 2015-07-17 VITALS — BP 123/74 | HR 79 | Resp 16

## 2015-07-17 DIAGNOSIS — M722 Plantar fascial fibromatosis: Secondary | ICD-10-CM

## 2015-07-17 DIAGNOSIS — M2011 Hallux valgus (acquired), right foot: Secondary | ICD-10-CM

## 2015-07-17 DIAGNOSIS — Z9889 Other specified postprocedural states: Secondary | ICD-10-CM

## 2015-07-17 DIAGNOSIS — T847XXD Infection and inflammatory reaction due to other internal orthopedic prosthetic devices, implants and grafts, subsequent encounter: Secondary | ICD-10-CM

## 2015-07-17 NOTE — Progress Notes (Signed)
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 She states that it has been a little swollen and tight lately.  Objective: Vital signs are stable she is alert and oriented 3. Pulses are strong and palpable. Neurologic sensorium is intact. Plaster dressing intact. Once removed demonstrates minimal edema no erythema cellulitis drainage or odor which is her intact margins are well coapted.  Assessment: Well-healing surgical foot. Removed all of her sutures today margins remain well coapted.  Plan: I encouraged range of motion exercises placed her in a compression angle and a Darco shoe. I will allow her to start washing this and I will follow-up with her in 2 weeks.

## 2015-07-31 ENCOUNTER — Ambulatory Visit (INDEPENDENT_AMBULATORY_CARE_PROVIDER_SITE_OTHER): Payer: BC Managed Care – PPO

## 2015-07-31 ENCOUNTER — Ambulatory Visit (INDEPENDENT_AMBULATORY_CARE_PROVIDER_SITE_OTHER): Payer: BC Managed Care – PPO | Admitting: Podiatry

## 2015-07-31 ENCOUNTER — Encounter: Payer: Self-pay | Admitting: Podiatry

## 2015-07-31 DIAGNOSIS — Z9889 Other specified postprocedural states: Secondary | ICD-10-CM

## 2015-07-31 DIAGNOSIS — M2011 Hallux valgus (acquired), right foot: Secondary | ICD-10-CM | POA: Diagnosis not present

## 2015-07-31 DIAGNOSIS — M722 Plantar fascial fibromatosis: Secondary | ICD-10-CM

## 2015-07-31 DIAGNOSIS — T847XXD Infection and inflammatory reaction due to other internal orthopedic prosthetic devices, implants and grafts, subsequent encounter: Secondary | ICD-10-CM

## 2015-07-31 NOTE — Progress Notes (Signed)
She presents today almost 1 month mark status post first and second metatarsal osteotomies of the right foot. She's doing very well exception of some swelling in the foot she states that the second metatarsal is quite sore.  Objective: Vital signs stable she is alert and oriented 3 she has good range of motion of the first metatarsophalangeal joint with tenderness on range of motion of the second toe at the level of the metatarsophalangeal joint. She does have some contracture deformity due to the scar. Radiographs demonstrate well-healing surgical osteotomies with screw fixation.  Assessment: Well-healing surgical foot status post 1 month.  Plan: Follow up with me in 1 month I did place her in a Darco digital splint and she will continue the use of her Darco shoe for another 2 weeks.

## 2015-08-05 ENCOUNTER — Ambulatory Visit
Admission: RE | Admit: 2015-08-05 | Discharge: 2015-08-05 | Disposition: A | Discharge status: Home / Self Care | Payer: BC Managed Care – PPO | Source: Ambulatory Visit | Attending: General Surgery | Admitting: General Surgery

## 2015-08-05 ENCOUNTER — Other Ambulatory Visit: Payer: Self-pay | Admitting: General Surgery

## 2015-08-05 DIAGNOSIS — Z1231 Encounter for screening mammogram for malignant neoplasm of breast: Secondary | ICD-10-CM | POA: Insufficient documentation

## 2015-08-07 ENCOUNTER — Encounter: Payer: Self-pay | Admitting: *Deleted

## 2015-08-14 ENCOUNTER — Ambulatory Visit (INDEPENDENT_AMBULATORY_CARE_PROVIDER_SITE_OTHER): Payer: BC Managed Care – PPO | Admitting: Podiatry

## 2015-08-14 ENCOUNTER — Ambulatory Visit (INDEPENDENT_AMBULATORY_CARE_PROVIDER_SITE_OTHER): Payer: BC Managed Care – PPO

## 2015-08-14 ENCOUNTER — Encounter: Payer: Self-pay | Admitting: Podiatry

## 2015-08-14 DIAGNOSIS — T847XXD Infection and inflammatory reaction due to other internal orthopedic prosthetic devices, implants and grafts, subsequent encounter: Secondary | ICD-10-CM

## 2015-08-14 DIAGNOSIS — M2011 Hallux valgus (acquired), right foot: Secondary | ICD-10-CM

## 2015-08-14 DIAGNOSIS — Z9889 Other specified postprocedural states: Secondary | ICD-10-CM

## 2015-08-14 DIAGNOSIS — M722 Plantar fascial fibromatosis: Secondary | ICD-10-CM

## 2015-08-14 NOTE — Progress Notes (Signed)
She presents today for postop visit date of surgery 07/05/2015 endoscopic plantar fasciotomy right foot first and second metatarsal osteotomies right foot. She states that she still has some tenderness and it's hard to find shoes that will accommodate her foot.  Objective: Vital signs are stable she is alert and oriented 3. Pulses are palpable. Neurologic sensory is intact. Shows good range of motion of the first metatarsophalangeal joint limited on second metatarsophalangeal joint particularly on plantar flexion. She has some tenderness on palpation of both sites. Redissected date Mr. well-healing surgical foot no major osseous issues no fractures identified.  Assessment: Well-healing surgical foot right.  Plan: We'll allow her to get back into regular shoe gear she will notify us with questions or concerns.

## 2015-08-19 ENCOUNTER — Ambulatory Visit (INDEPENDENT_AMBULATORY_CARE_PROVIDER_SITE_OTHER): Payer: BC Managed Care – PPO | Admitting: General Surgery

## 2015-08-19 ENCOUNTER — Encounter: Payer: Self-pay | Admitting: General Surgery

## 2015-08-19 VITALS — BP 120/72 | HR 74 | Resp 12 | Ht 65.0 in | Wt 133.0 lb

## 2015-08-19 DIAGNOSIS — Z800 Family history of malignant neoplasm of digestive organs: Secondary | ICD-10-CM

## 2015-08-19 DIAGNOSIS — Z1239 Encounter for other screening for malignant neoplasm of breast: Secondary | ICD-10-CM | POA: Diagnosis not present

## 2015-08-19 MED ORDER — POLYETHYLENE GLYCOL 3350 17 GM/SCOOP PO POWD: ORAL | Status: DC

## 2015-08-19 NOTE — Progress Notes (Signed)
Patient ID: Laurie Figueroa, female   DOB: 08-12-1955, 59 y.o.   MRN: [id number]  Chief Complaint  Patient presents with  . Follow-up    mammogram    HPI Laurie Figueroa is a 60 y.o. female who presents for a breast evaluation. The most recent mammogram was done on 08/05/15 .  Patient does perform regular self breast checks and gets regular mammograms done.  Patient brother was diagnosed with colon cancer this year.  HPI  Past Medical History  Diagnosis Date  . Diffuse cystic mastopathy 2012  . Breast screening, unspecified 2013  . Special screening for malignant neoplasms, colon 2013  . Constipation     Past Surgical History  Procedure Laterality Date  . Colonoscopy  2009    Dr. Jamal Collin  . Bunionectomy  2008  . Toe surgery  2005  . Breast biopsy Left     benign    Family History  Problem Relation Age of Onset  . Cancer Mother     pancreatic cancer  . Breast cancer Maternal Aunt   . Diabetes Brother   . Heart disease Maternal Grandmother   . Ovarian cancer Neg Hx   . Colon cancer Brother     adenoczarcinoma    Social History Social History  Substance Use Topics  . Smoking status: Never Smoker   . Smokeless tobacco: Never Used  . Alcohol Use: 0.0 oz/week     Comment: ocassionally    Allergies  Allergen Reactions  . Codeine Nausea Only  . Penicillins Rash  . Sulfa Antibiotics Rash    Current Outpatient Prescriptions  Medication Sig Dispense Refill  . fluticasone (FLONASE) 50 MCG/ACT nasal spray PLACE 2 SPRAYS INTO BOTH NOSTRILS ONCE DAILY.  0  . ondansetron (ZOFRAN) 4 MG tablet Take 1 tablet (4 mg total) by mouth every 8 (eight) hours as needed for nausea or vomiting. 30 tablet 0  . cetirizine (ZYRTEC) 10 MG tablet Reported on 08/19/2015  0  . Dermatological Products, Misc. (HPR PLUS) CREA Reported on 08/19/2015    . meperidine (DEMEROL) 50 MG tablet Take one to two capsules by mouth every six to eight hours as needed for pain. (Patient not taking: Reported  on 08/19/2015) 50 tablet 0  . polyethylene glycol powder (GLYCOLAX/MIRALAX) powder 255 grams one bottle for colonoscopy prep 255 g 0  . psyllium (METAMUCIL) 58.6 % powder Take 1 packet by mouth 3 (three) times daily. Reported on 08/19/2015    . triamcinolone ointment (KENALOG) 0.1 % Reported on 08/19/2015  1   No current facility-administered medications for this visit.    Review of Systems Review of Systems  Constitutional: Negative.   Respiratory: Negative.   Cardiovascular: Negative.     Blood pressure 120/72, pulse 74, resp. rate 12, height _0  (1.651 m), weight 133 lb (60.328 kg).  Physical Exam Physical Exam  Constitutional: She is oriented to person, place, and time. She appears well-developed and well-nourished.  Eyes: Conjunctivae are normal. No scleral icterus.  Neck: Neck supple.  Cardiovascular: Normal rate, regular rhythm and normal heart sounds.   Pulmonary/Chest: Effort normal and breath sounds normal. Right breast exhibits no inverted nipple, no mass, no nipple discharge, no skin change and no tenderness. Left breast exhibits no inverted nipple, no mass, no nipple discharge, no skin change and no tenderness. Breasts are symmetrical.  Abdominal: Soft. Normal appearance and bowel sounds are normal. There is no hepatomegaly. There is no tenderness. No hernia.  Lymphadenopathy:    She has  no cervical adenopathy.  Neurological: She is oriented to person, place, and time.  Skin: Skin is warm and dry.    Data Reviewed Mammogram reviewed   Assessment FCD of breast   Family history of colon cancer-he had right colectomy few weeks ago.This was stage I. MSI was positive. Pt had last colonoscopy 73yr ago. Given the new history will plan for colonoscopy now.    Plan    The patient has been asked to return to the office in one year with a bilateral screening mammogram.  Colonoscopy with possible biopsy/polypectomy prn: Information regarding the procedure, including its  potential risks and complications (including but not limited to perforation of the bowel, which may require emergency surgery to repair, and bleeding) was verbally given to the patient. Educational information regarding lower intestinal endoscopy was given to the patient. Written instructions for how to complete the bowel prep using Miralax were provided. The importance of drinking ample fluids to avoid dehydration as a result of the prep emphasized.  [facility]     PCP:  No Pcp   This information has been scribed by JGaspar ColaCMA.     [name] 08/20/2015, 9:19 AM

## 2015-08-19 NOTE — Patient Instructions (Addendum)
The patient has been asked to return to the office in one year with a bilateral screening mammogram.   Colonoscopy A colonoscopy is an exam to look at the entire large intestine (colon). This exam can help find problems such as tumors, polyps, inflammation, and areas of bleeding. The exam takes about 1 hour.  LET [facility] CARE PROVIDER KNOW ABOUT:   Any allergies you have.  All medicines you are taking, including vitamins, herbs, eye drops, creams, and over-the-counter medicines.  Previous problems you or members of your family have had with the use of anesthetics.  Any blood disorders you have.  Previous surgeries you have had.  Medical conditions you have. RISKS AND COMPLICATIONS  Generally, this is a safe procedure. However, as with any procedure, complications can occur. Possible complications include:  Bleeding.  Tearing or rupture of the colon wall.  Reaction to medicines given during the exam.  Infection (rare). BEFORE THE PROCEDURE   Ask your health care provider about changing or stopping your regular medicines.  You may be prescribed an oral bowel prep. This involves drinking a large amount of medicated liquid, starting the day before your procedure. The liquid will cause you to have multiple loose stools until your stool is almost clear or light green. This cleans out your colon in preparation for the procedure.  Do not eat or drink anything else once you have started the bowel prep, unless your health care provider tells you it is safe to do so.  Arrange for someone to drive you home after the procedure. PROCEDURE   You will be given medicine to help you relax (sedative).  You will lie on your side with your knees bent.  A long, flexible tube with a light and camera on the end (colonoscope) will be inserted through the rectum and into the colon. The camera sends video back to a computer screen as it moves through the colon. The colonoscope also releases carbon  dioxide gas to inflate the colon. This helps your health care provider see the area better.  During the exam, your health care provider may take a small tissue sample (biopsy) to be examined under a microscope if any abnormalities are found.  The exam is finished when the entire colon has been viewed. AFTER THE PROCEDURE   Do not drive for 24 hours after the exam.  You may have a small amount of blood in your stool.  You may pass moderate amounts of gas and have mild abdominal cramping or bloating. This is caused by the gas used to inflate your colon during the exam.  Ask when your test results will be ready and how you will get your results. Make sure you get your test results.   This information is not intended to replace advice given to you by your health care provider. Make sure you discuss any questions you have with your health care provider.   Document Released: 01/24/2000 Document Revised: 11/16/2012 Document Reviewed: 10/03/2012 Elsevier Interactive Patient Education [employer].

## 2015-08-20 ENCOUNTER — Telehealth: Payer: Self-pay | Admitting: *Deleted

## 2015-08-20 ENCOUNTER — Encounter: Payer: Self-pay | Admitting: General Surgery

## 2015-08-20 ENCOUNTER — Encounter: Payer: Self-pay | Admitting: *Deleted

## 2015-08-20 LAB — HEPATIC FUNCTION PANEL
ALBUMIN: 4.7 g/dL (ref 3.5–5.5)
ALT: 12 IU/L (ref 0–32)
AST: 13 IU/L (ref 0–40)
Alkaline Phosphatase: 50 IU/L (ref 39–117)
BILIRUBIN TOTAL: 0.5 mg/dL (ref 0.0–1.2)
BILIRUBIN, DIRECT: 0.14 mg/dL (ref 0.00–0.40)
TOTAL PROTEIN: 7.1 g/dL (ref 6.0–8.5)

## 2015-08-20 LAB — CEA: CEA: 1.2 ng/mL (ref 0.0–4.7)

## 2015-08-20 NOTE — Telephone Encounter (Signed)
-----   Message from Christene Lye, MD sent at 08/20/2015  7:44 AM EDT ----- Inform pt labs are normal. F/u as scheduled

## 2015-08-20 NOTE — Progress Notes (Signed)
Quick Note:  Inform pt labs are normal. F/u as scheduled ______ 

## 2015-08-20 NOTE — Telephone Encounter (Signed)
Notified patient as instructed, patient pleased. Discussed follow-up appointments, patient agrees  

## 2015-08-27 ENCOUNTER — Encounter: Payer: Self-pay | Admitting: *Deleted

## 2015-08-28 ENCOUNTER — Ambulatory Visit: Payer: BC Managed Care – PPO | Admitting: Anesthesiology

## 2015-08-28 ENCOUNTER — Encounter
Admission: RE | Disposition: A | Discharge status: Home / Self Care | Payer: Self-pay | Source: Ambulatory Visit | Attending: General Surgery

## 2015-08-28 ENCOUNTER — Ambulatory Visit
Admission: RE | Admit: 2015-08-28 | Discharge: 2015-08-28 | Disposition: A | Discharge status: Home / Self Care | Payer: BC Managed Care – PPO | Source: Ambulatory Visit | Attending: General Surgery | Admitting: General Surgery

## 2015-08-28 ENCOUNTER — Encounter: Payer: Self-pay | Admitting: *Deleted

## 2015-08-28 DIAGNOSIS — Z882 Allergy status to sulfonamides status: Secondary | ICD-10-CM | POA: Insufficient documentation

## 2015-08-28 DIAGNOSIS — Z880 Allergy status to penicillin: Secondary | ICD-10-CM | POA: Insufficient documentation

## 2015-08-28 DIAGNOSIS — Z79899 Other long term (current) drug therapy: Secondary | ICD-10-CM | POA: Insufficient documentation

## 2015-08-28 DIAGNOSIS — K5730 Diverticulosis of large intestine without perforation or abscess without bleeding: Secondary | ICD-10-CM | POA: Insufficient documentation

## 2015-08-28 DIAGNOSIS — Z885 Allergy status to narcotic agent status: Secondary | ICD-10-CM | POA: Insufficient documentation

## 2015-08-28 DIAGNOSIS — Z800 Family history of malignant neoplasm of digestive organs: Secondary | ICD-10-CM | POA: Insufficient documentation

## 2015-08-28 DIAGNOSIS — Z8249 Family history of ischemic heart disease and other diseases of the circulatory system: Secondary | ICD-10-CM | POA: Insufficient documentation

## 2015-08-28 DIAGNOSIS — Z1211 Encounter for screening for malignant neoplasm of colon: Secondary | ICD-10-CM | POA: Insufficient documentation

## 2015-08-28 DIAGNOSIS — K640 First degree hemorrhoids: Secondary | ICD-10-CM | POA: Insufficient documentation

## 2015-08-28 DIAGNOSIS — Z803 Family history of malignant neoplasm of breast: Secondary | ICD-10-CM | POA: Insufficient documentation

## 2015-08-28 HISTORY — PX: COLONOSCOPY WITH PROPOFOL: SHX5780

## 2015-08-28 SURGERY — COLONOSCOPY WITH PROPOFOL
Anesthesia: General

## 2015-08-28 MED ORDER — ONDANSETRON HCL 4 MG/2ML IJ SOLN
INTRAMUSCULAR | Status: DC | PRN
  Administered 2015-08-28: 4 mg via INTRAVENOUS

## 2015-08-28 MED ORDER — MIDAZOLAM HCL 5 MG/5ML IJ SOLN
INTRAMUSCULAR | Status: DC | PRN
  Administered 2015-08-28: 1 mg via INTRAVENOUS

## 2015-08-28 MED ORDER — PROPOFOL 10 MG/ML IV BOLUS
INTRAVENOUS | Status: DC | PRN
  Administered 2015-08-28: 40 mg via INTRAVENOUS

## 2015-08-28 MED ORDER — FENTANYL CITRATE (PF) 100 MCG/2ML IJ SOLN
INTRAMUSCULAR | Status: DC | PRN
  Administered 2015-08-28: 50 ug via INTRAVENOUS

## 2015-08-28 MED ORDER — SODIUM CHLORIDE 0.9 % IV SOLN
INTRAVENOUS | Status: DC
  Administered 2015-08-28: 08:00:00 via INTRAVENOUS

## 2015-08-28 MED ORDER — PROPOFOL 500 MG/50ML IV EMUL
INTRAVENOUS | Status: DC | PRN
  Administered 2015-08-28: 100 ug/kg/min via INTRAVENOUS

## 2015-08-28 NOTE — Op Note (Signed)
[facility] Patient Name: Laurie Figueroa Procedure Date: 08/28/2015 9:06 AM MRN: [id number] Account #: [id number] Date of Birth: Jun 28, 1955 Admit Type: Outpatient Age: 60 Room: [facility] ENDO ROOM 1 Gender: Female Note Status: Finalized Procedure:            Colonoscopy Indications:          Family history of colon cancer in a first-degree                        relative Providers:            Amairani Shuey G. Jamal Collin, MD Referring MD:         Alanda Slim. Defrancesco, MD (Referring MD) Medicines:            General Anesthesia Complications:        No immediate complications. Procedure:            Pre-Anesthesia Assessment:                       - Using IV propofol under the supervision of an                        anesthesiologist was determined to be medically                        necessary for this procedure based on review of the                        patient's medical history, medications, and prior                        anesthesia history.                       After obtaining informed consent, the colonoscope was                        passed under direct vision. Throughout the procedure,                        the patient's blood pressure, pulse, and oxygen                        saturations were monitored continuously. The                        Colonoscope was introduced through the anus and                        advanced to the the cecum, identified by the ileocecal                        valve. The colonoscopy was performed without                        difficulty. The patient tolerated the procedure well.                        The quality of the bowel preparation was excellent. Findings:      The perianal and digital rectal examinations were normal.      Many small  and large-mouthed diverticula were found in the entire colon.      Internal hemorrhoids were found during retroflexion. The hemorrhoids       were small and Grade I (internal  hemorrhoids that do not prolapse).      The exam was otherwise without abnormality on direct and retroflexion       views. Impression:           - Diverticulosis in the entire examined colon.                       - Internal hemorrhoids.                       - The examination was otherwise normal on direct and                        retroflexion views.                       - No specimens collected. Recommendation:       - Discharge patient to home.                       - Repeat colonoscopy in 5 years for surveillance. Procedure Code(s):    --- Professional ---                       [phone], Colonoscopy, flexible; diagnostic, including                        collection of specimen(s) by brushing or washing, when                        performed (separate procedure) Diagnosis Code(s):    --- Professional ---                       K64.0, First degree hemorrhoids                       Z80.0, Family history of malignant neoplasm of                        digestive organs                       K57.30, Diverticulosis of large intestine without                        perforation or abscess without bleeding CPT copyright 2016 American Medical Association. All rights reserved. The codes documented in this report are preliminary and upon coder review may  be revised to meet current compliance requirements. Christene Lye, MD 08/28/2015 9:43:25 AM This report has been signed electronically. Number of Addenda: 0 Note Initiated On: 08/28/2015 9:06 AM Scope Withdrawal Time: 0 hours 5 minutes 17 seconds  Total Procedure Duration: 0 hours 27 minutes 49 seconds       [facility]

## 2015-08-28 NOTE — Anesthesia Postprocedure Evaluation (Signed)
Anesthesia Post Note  Patient: [name]  Procedure(s) Performed: Procedure(s) (LRB): COLONOSCOPY WITH PROPOFOL (N/A)  Patient location during evaluation: Endoscopy Anesthesia Type: General Level of consciousness: awake and alert Pain management: pain level controlled Vital Signs Assessment: post-procedure vital signs reviewed and stable Respiratory status: spontaneous breathing, nonlabored ventilation, respiratory function stable and patient connected to nasal cannula oxygen Cardiovascular status: blood pressure returned to baseline and stable Postop Assessment: no signs of nausea or vomiting Anesthetic complications: no    Last Vitals:  Filed Vitals:   08/28/15 0946 08/28/15 0947  BP: 108/67   Pulse: 67   Temp: 35.9 C 35.9 C  Resp: 12     Last Pain: There were no vitals filed for this visit.               Martha Clan

## 2015-08-28 NOTE — Transfer of Care (Signed)
Immediate Anesthesia Transfer of Care Note  Patient: Laurie Figueroa  Procedure(s) Performed: Procedure(s): COLONOSCOPY WITH PROPOFOL (N/A)  Patient Location: PACU and Endoscopy Unit  Anesthesia Type:General  Level of Consciousness: awake, alert  and oriented  Airway & Oxygen Therapy: Patient Spontanous Breathing and Patient connected to nasal cannula oxygen  Post-op Assessment: Report given to RN and Post -op Vital signs reviewed and stable  Post vital signs: Reviewed and stable  Last Vitals:  Filed Vitals:   08/28/15 0806 08/28/15 0947  BP: 132/79   Pulse: 66   Temp: 36.3 C 35.9 C  Resp: 18     Last Pain: There were no vitals filed for this visit.       Complications: No apparent anesthesia complications

## 2015-08-28 NOTE — Interval H&P Note (Signed)
History and Physical Interval Note:  08/28/2015 9:03 AM  Laurie Figueroa  has presented today for surgery, with the diagnosis of FH COLON CA  The various methods of treatment have been discussed with the patient and family. After consideration of risks, benefits and other options for treatment, the patient has consented to  Procedure(s): COLONOSCOPY WITH PROPOFOL (N/A) as a surgical intervention .  The patient's history has been reviewed, patient examined, no change in status, stable for surgery.  I have reviewed the patient's chart and labs.  Questions were answered to the patient's satisfaction.     [name],[name]

## 2015-08-28 NOTE — Anesthesia Procedure Notes (Signed)
Date/Time: 08/28/2015 9:10 AM Performed by: Kennon Holter Pre-anesthesia Checklist: Timeout performed, Patient being monitored, Suction available, Emergency Drugs available and Patient identified Patient Re-evaluated:Patient Re-evaluated prior to inductionOxygen Delivery Method: Circle system utilized, Non-rebreather mask, Simple face mask, Nasal cannula and Ambu bag Preoxygenation: Pre-oxygenation with 100% oxygen Intubation Type: IV induction Placement Confirmation: positive ETCO2 and breath sounds checked- equal and bilateral

## 2015-08-28 NOTE — Anesthesia Preprocedure Evaluation (Signed)
Anesthesia Evaluation  Patient identified by MRN, date of birth, ID band Patient awake    Reviewed: Allergy & Precautions, H&P , NPO status , Patient's Chart, lab work & pertinent test results, reviewed documented beta blocker date and time   History of Anesthesia Complications (+) PONV and history of anesthetic complications  Airway Mallampati: I  TM Distance: >3 FB Neck ROM: full    Dental no notable dental hx. (+) Caps, Chipped   Pulmonary neg pulmonary ROS,    Pulmonary exam normal breath sounds clear to auscultation       Cardiovascular Exercise Tolerance: Good (-) hypertension(-) CAD, (-) Past MI, (-) Cardiac Stents and (-) CABG Normal cardiovascular exam+ dysrhythmias (currently in NSR) Atrial Fibrillation (-) Valvular Problems/Murmurs Rhythm:regular Rate:Normal     Neuro/Psych negative neurological ROS  negative psych ROS   GI/Hepatic negative GI ROS, Neg liver ROS,   Endo/Other  negative endocrine ROS  Renal/GU negative Renal ROS  negative genitourinary   Musculoskeletal   Abdominal   Peds  Hematology negative hematology ROS (+)   Anesthesia Other Findings Past Medical History:   Diffuse cystic mastopathy                       2012         Breast screening, unspecified                   2013         Special screening for malignant neoplasms, col* 2013         Constipation                                                 Reproductive/Obstetrics negative OB ROS                             Anesthesia Physical Anesthesia Plan  ASA: II  Anesthesia Plan: General   Post-op Pain Management:    Induction:   Airway Management Planned:   Additional Equipment:   Intra-op Plan:   Post-operative Plan:   Informed Consent: I have reviewed the patients History and Physical, chart, labs and discussed the procedure including the risks, benefits and alternatives for the proposed  anesthesia with the patient or authorized representative who has indicated his/her understanding and acceptance.   Dental Advisory Given  Plan Discussed with: Anesthesiologist, CRNA and Surgeon  Anesthesia Plan Comments:         Anesthesia Quick Evaluation

## 2015-08-28 NOTE — H&P (View-Only) (Signed)
Patient ID: Laurie Figueroa, female   DOB: 11/16/1955, 60 y.o.   MRN: [id number]  Chief Complaint  Patient presents with  . Follow-up    mammogram    HPI Laurie Figueroa is a 60 y.o. female who presents for a breast evaluation. The most recent mammogram was done on 08/05/15 .  Patient does perform regular self breast checks and gets regular mammograms done.  Patient brother was diagnosed with colon cancer this year.  HPI  Past Medical History  Diagnosis Date  . Diffuse cystic mastopathy 2012  . Breast screening, unspecified 2013  . Special screening for malignant neoplasms, colon 2013  . Constipation     Past Surgical History  Procedure Laterality Date  . Colonoscopy  2009    Dr. [name]  . Bunionectomy  2008  . Toe surgery  2005  . Breast biopsy Left     benign    Family History  Problem Relation Age of Onset  . Cancer Mother     pancreatic cancer  . Breast cancer Maternal Aunt   . Diabetes Brother   . Heart disease Maternal Grandmother   . Ovarian cancer Neg Hx   . Colon cancer Brother     adenoczarcinoma    Social History Social History  Substance Use Topics  . Smoking status: Never Smoker   . Smokeless tobacco: Never Used  . Alcohol Use: 0.0 oz/week     Comment: ocassionally    Allergies  Allergen Reactions  . Codeine Nausea Only  . Penicillins Rash  . Sulfa Antibiotics Rash    Current Outpatient Prescriptions  Medication Sig Dispense Refill  . fluticasone (FLONASE) 50 MCG/ACT nasal spray PLACE 2 SPRAYS INTO BOTH NOSTRILS ONCE DAILY.  0  . ondansetron (ZOFRAN) 4 MG tablet Take 1 tablet (4 mg total) by mouth every 8 (eight) hours as needed for nausea or vomiting. 30 tablet 0  . cetirizine (ZYRTEC) 10 MG tablet Reported on 08/19/2015  0  . Dermatological Products, Misc. (HPR PLUS) CREA Reported on 08/19/2015    . meperidine (DEMEROL) 50 MG tablet Take one to two capsules by mouth every six to eight hours as needed for pain. (Patient not taking: Reported  on 08/19/2015) 50 tablet 0  . polyethylene glycol powder (GLYCOLAX/MIRALAX) powder 255 grams one bottle for colonoscopy prep 255 g 0  . psyllium (METAMUCIL) 58.6 % powder Take 1 packet by mouth 3 (three) times daily. Reported on 08/19/2015    . triamcinolone ointment (KENALOG) 0.1 % Reported on 08/19/2015  1   No current facility-administered medications for this visit.    Review of Systems Review of Systems  Constitutional: Negative.   Respiratory: Negative.   Cardiovascular: Negative.     Blood pressure 120/72, pulse 74, resp. rate 12, height 5' 5" (1.651 m), weight 133 lb (60.328 kg).  Physical Exam Physical Exam  Constitutional: She is oriented to person, place, and time. She appears well-developed and well-nourished.  Eyes: Conjunctivae are normal. No scleral icterus.  Neck: Neck supple.  Cardiovascular: Normal rate, regular rhythm and normal heart sounds.   Pulmonary/Chest: Effort normal and breath sounds normal. Right breast exhibits no inverted nipple, no mass, no nipple discharge, no skin change and no tenderness. Left breast exhibits no inverted nipple, no mass, no nipple discharge, no skin change and no tenderness. Breasts are symmetrical.  Abdominal: Soft. Normal appearance and bowel sounds are normal. There is no hepatomegaly. There is no tenderness. No hernia.  Lymphadenopathy:    She has   no cervical adenopathy.  Neurological: She is oriented to person, place, and time.  Skin: Skin is warm and dry.    Data Reviewed Mammogram reviewed   Assessment FCD of breast   Family history of colon cancer-he had right colectomy few weeks ago.This was stage I. MSI was positive. Pt had last colonoscopy 8yrs ago. Given the new history will plan for colonoscopy now.    Plan    The patient has been asked to return to the office in one year with a bilateral screening mammogram.  Colonoscopy with possible biopsy/polypectomy prn: Information regarding the procedure, including its  potential risks and complications (including but not limited to perforation of the bowel, which may require emergency surgery to repair, and bleeding) was verbally given to the patient. Educational information regarding lower intestinal endoscopy was given to the patient. Written instructions for how to complete the bowel prep using Miralax were provided. The importance of drinking ample fluids to avoid dehydration as a result of the prep emphasized.  Patient has been scheduled for a colonoscopy on 08-28-15 at [facility].     PCP:  No Pcp   This information has been scribed by Jessica Qualls CMA.     [name],[name] 08/20/2015, 9:19 AM    

## 2015-08-29 ENCOUNTER — Encounter: Payer: Self-pay | Admitting: General Surgery

## 2015-09-25 ENCOUNTER — Encounter: Payer: Self-pay | Admitting: Podiatry

## 2015-09-25 ENCOUNTER — Ambulatory Visit (INDEPENDENT_AMBULATORY_CARE_PROVIDER_SITE_OTHER): Payer: BC Managed Care – PPO

## 2015-09-25 ENCOUNTER — Ambulatory Visit (INDEPENDENT_AMBULATORY_CARE_PROVIDER_SITE_OTHER): Payer: BC Managed Care – PPO | Admitting: Podiatry

## 2015-09-25 DIAGNOSIS — M2011 Hallux valgus (acquired), right foot: Secondary | ICD-10-CM

## 2015-09-25 DIAGNOSIS — Z9889 Other specified postprocedural states: Secondary | ICD-10-CM

## 2015-09-25 DIAGNOSIS — M722 Plantar fascial fibromatosis: Secondary | ICD-10-CM

## 2015-09-25 DIAGNOSIS — T847XXD Infection and inflammatory reaction due to other internal orthopedic prosthetic devices, implants and grafts, subsequent encounter: Secondary | ICD-10-CM

## 2015-09-25 MED ORDER — DICLOFENAC SODIUM 1 % TD GEL: 4.0000 g | Freq: Four times a day (QID) | TRANSDERMAL | 4 refills | Status: DC

## 2015-09-25 NOTE — Progress Notes (Signed)
She presents today for follow-up of her bunion repair right foot second metatarsal osteotomy right foot. She states that the foot is still sore and painful and she presents today with a limp to the right side. Date of surgery 07/07/2015.  Objective: Vital signs stable alert and oriented 3. She has a right swollen foot forefoot overlying the surgical sites. She has tenderness on range of motion first metatarsophalangeal joint and pain with plantar flexion. She still is unable to plantarflex the first metatarsal laryngeal joint and second toe. Radiographs Demser well-healing surgical foot.  Plan: I encouraged her to seek treatment with physical therapy and I provided her with an topical anti-inflammatory. I will follow-up with her in 4-6 weeks if she is not improved.

## 2015-10-10 NOTE — Progress Notes (Signed)
DOS 07/05/2015 [place] bunion repair rt foot, EPF rt foot, removal fixation 1st metatarsal left foot

## 2015-10-11 NOTE — Progress Notes (Signed)
ANNUAL PREVENTATIVE CARE GYN  ENCOUNTER NOTE  Subjective:       Laurie Figueroa is a 60 y.o. G69P3003 female here for a routine annual gynecologic exam.  Current complaints: 1.  none   No significant vasomotor symptoms or vaginal atrophy symptoms. No postmenopausal bleeding. Chronic constipation is stable; Brother recently diagnosed with colon cancer; colonoscopy this year inpatient revealed external hemorrhoids and diverticulosis   Gynecologic History No LMP recorded. Patient is postmenopausal. Contraception: post menopausal status Last Pap: 10/2014 neg/neg. Results were: normal Last mammogram: 07/2015 birad 1. Results were: normal  Obstetric History OB History  Gravida Para Term Preterm AB Living  3 3 3     3   SAB TAB Ectopic Multiple Live Births               # Outcome Date GA Lbr Len/2nd Weight Sex Delivery Anes PTL Lv  3 Term 1994   7 lb 1.8 oz (3.225 kg) F Vag-Spont     2 Term 1991   7 lb 2.3 oz (3.239 kg) F Vag-Spont     1 Term 1988   7 lb 6.4 oz (3.357 kg) M Vag-Spont       Obstetric Comments  Age at first pregnancy-31  Age at first menstruation-12  Age at last period-53    Past Medical History:  Diagnosis Date  . Breast screening, unspecified 2013  . Constipation   . Diffuse cystic mastopathy 2012  . Special screening for malignant neoplasms, colon 2013    Past Surgical History:  Procedure Laterality Date  . BREAST BIOPSY Left    benign  . BUNIONECTOMY  2008  . COLONOSCOPY  2009   Dr. Jamal Collin  . COLONOSCOPY WITH PROPOFOL N/A 08/28/2015   Procedure: COLONOSCOPY WITH PROPOFOL;  Surgeon: Christene Lye, MD;  Location: [facility] ENDOSCOPY;  Service: Endoscopy;  Laterality: N/A;  . TOE SURGERY  2005    Current Outpatient Prescriptions on File Prior to Visit  Medication Sig Dispense Refill  . cetirizine (ZYRTEC) 10 MG tablet Reported on 08/19/2015  0  . Dermatological Products, Misc. (HPR PLUS) CREA Reported on 08/19/2015    . diclofenac sodium (VOLTAREN) 1 %  GEL Apply 4 g topically 4 (four) times daily. 100 g 4  . fluticasone (FLONASE) 50 MCG/ACT nasal spray PLACE 2 SPRAYS INTO BOTH NOSTRILS ONCE DAILY.  0  . meperidine (DEMEROL) 50 MG tablet Take one to two capsules by mouth every six to eight hours as needed for pain. (Patient not taking: Reported on 08/19/2015) 50 tablet 0  . ondansetron (ZOFRAN) 4 MG tablet Take 1 tablet (4 mg total) by mouth every 8 (eight) hours as needed for nausea or vomiting. 30 tablet 0  . polyethylene glycol powder (GLYCOLAX/MIRALAX) powder 255 grams one bottle for colonoscopy prep 255 g 0  . psyllium (METAMUCIL) 58.6 % powder Take 1 packet by mouth 3 (three) times daily. Reported on 08/19/2015    . triamcinolone ointment (KENALOG) 0.1 % Reported on 08/19/2015  1   No current facility-administered medications on file prior to visit.     Allergies  Allergen Reactions  . Codeine Nausea Only  . Penicillins Rash  . Sulfa Antibiotics Rash    Social History   Social History  . Marital status: Married    Spouse name: N/A  . Number of children: N/A  . Years of education: N/A   Occupational History  . Not on file.   Social History Main Topics  . Smoking status: Never Smoker  .  Smokeless tobacco: Never Used  . Alcohol use 0.0 oz/week     Comment: ocassionally  . Drug use: No  . Sexual activity: Yes    Birth control/ protection: Post-menopausal   Other Topics Concern  . Not on file   Social History Narrative  . No narrative on file    Family History  Problem Relation Age of Onset  . Cancer Mother     pancreatic cancer  . Breast cancer Maternal Aunt   . Diabetes Brother   . Heart disease Maternal Grandmother   . Ovarian cancer Neg Hx   . Colon cancer Brother     adenoczarcinoma    The following portions of the patient's history were reviewed and updated as appropriate: allergies, current medications, past family history, past medical history, past social history, past surgical history and problem  list.  Review of Systems ROS Review of Systems - General ROS: negative for - chills, fatigue, fever, hot flashes, night sweats, weight gain or weight loss Psychological ROS: negative for - anxiety, decreased libido, depression, mood swings, physical abuse or sexual abuse Ophthalmic ROS: negative for - blurry vision, eye pain or loss of vision ENT ROS: negative for - headaches, hearing change, visual changes or vocal changes Allergy and Immunology ROS: negative for - hives, itchy/watery eyes or seasonal allergies Hematological and Lymphatic ROS: negative for - bleeding problems, bruising, swollen lymph nodes or weight loss Endocrine ROS: negative for - galactorrhea, hair pattern changes, hot flashes, malaise/lethargy, mood swings, palpitations, polydipsia/polyuria, skin changes, temperature intolerance or unexpected weight changes Breast ROS: negative for - new or changing breast lumps or nipple discharge Respiratory ROS: negative for - cough or shortness of breath Cardiovascular ROS: negative for - chest pain, irregular heartbeat, palpitations or shortness of breath Gastrointestinal ROS: no abdominal pain, change in bowel habits, or black or bloody stools Genito-Urinary ROS: no dysuria, trouble voiding, or hematuria Musculoskeletal ROS: negative for - joint pain or joint stiffness Neurological ROS: negative for - bowel and bladder control changes Dermatological ROS: negative for rash and skin lesion changes   Objective:   BP 116/75   Pulse 71   Ht 5\' 5"  (1.651 m)   Wt 135 lb 3.2 oz (61.3 kg)   BMI 22.50 kg/m .  CONSTITUTIONAL: Well-developed, well-nourished female in no acute distress.  PSYCHIATRIC: Normal mood and affect. Normal behavior. Normal judgment and thought content. [place]: Alert and oriented to person, place, and time. Normal muscle tone coordination. No cranial nerve deficit noted. HENT:  Normocephalic, atraumatic, External right and left ear normal. Oropharynx is clear  and moist EYES: Conjunctivae and EOM are normal. Pupils are equal, round, and reactive to light. No scleral icterus.  NECK: Normal range of motion, supple, no masses.  Normal thyroid.  SKIN: Skin is warm and dry. No rash noted. Not diaphoretic. No erythema. No pallor. CARDIOVASCULAR: Normal heart rate noted, regular rhythm, no murmur. RESPIRATORY: Clear to auscultation bilaterally. Effort and breath sounds normal, no problems with respiration noted. BREASTS: Symmetric in size. No masses, skin changes, nipple drainage, or lymphadenopathy. ABDOMEN: Soft, normal bowel sounds, no distention noted.  No tenderness, rebound or guarding.  BLADDER: Normal PELVIC:  External Genitalia: Normal  BUS: Normal  Vagina: Moderate vaginal atrophy  Cervix: Normal; parous, no lesions  Uterus: Normal; midplane, normal size and shape, mobile  Adnexa: Normal; nonpalpable and nontender  RV: External Exam NormaI, No Rectal Masses and Normal Sphincter tone  MUSCULOSKELETAL: Normal range of motion. No tenderness.  No cyanosis, clubbing,  or edema.  2+ distal pulses. LYMPHATIC: No Axillary, Supraclavicular, or Inguinal Adenopathy.    Assessment:   Annual gynecologic examination 60 y.o. Contraception: post menopausal status Normal BMI Problem List Items Addressed This Visit    Menopause   Vaginal atrophy    Other Visit Diagnoses    Well woman exam with routine gynecological exam    -  Primary   Screening for colon cancer        Family history Colon Cancer (brother  Plan:  Pap: due 2019 Mammogram: utd Stool Guaiac Testing:  colonoscopy- 08/2015- wnl Labs: vit d lipid a1c fbs tsh Routine preventative health maintenance measures emphasized: Exercise/Diet/Weight control, Tobacco Warnings and Alcohol/Substance use risks Calcium with vitamin D supplementation is encouraged Return to [place], CMA  Brayton Mars, MD  Note: This dictation was prepared with Dragon dictation  along with smaller phrase technology. Any transcriptional errors that result from this process are unintentional.

## 2015-10-15 ENCOUNTER — Encounter: Payer: Self-pay | Admitting: Obstetrics and Gynecology

## 2015-10-15 ENCOUNTER — Ambulatory Visit (INDEPENDENT_AMBULATORY_CARE_PROVIDER_SITE_OTHER): Payer: BC Managed Care – PPO | Admitting: Obstetrics and Gynecology

## 2015-10-15 VITALS — BP 116/75 | HR 71 | Ht 65.0 in | Wt 135.2 lb

## 2015-10-15 DIAGNOSIS — N952 Postmenopausal atrophic vaginitis: Secondary | ICD-10-CM | POA: Diagnosis not present

## 2015-10-15 DIAGNOSIS — Z780 Asymptomatic menopausal state: Secondary | ICD-10-CM

## 2015-10-15 DIAGNOSIS — Z01419 Encounter for gynecological examination (general) (routine) without abnormal findings: Secondary | ICD-10-CM

## 2015-10-15 DIAGNOSIS — Z800 Family history of malignant neoplasm of digestive organs: Secondary | ICD-10-CM | POA: Diagnosis not present

## 2015-10-15 DIAGNOSIS — Z1211 Encounter for screening for malignant neoplasm of colon: Secondary | ICD-10-CM | POA: Diagnosis not present

## 2015-10-15 NOTE — Patient Instructions (Signed)
1. No Pap smear is needed until 2019 2. Mammogram is already been obtained-normal 3. Screening colonoscopy has been done this year because of Brother with colon cancer diagnosis-diagnoses: External hemorrhoids and diverticulosis 4. Encouraged continued healthy eating and exercise 5. Encourage calcium with vitamin D supplementation 1200 mg/800 international units daily 6. Return in 1 year for physical

## 2015-10-16 LAB — VITAMIN D 25 HYDROXY (VIT D DEFICIENCY, FRACTURES): Vit D, 25-Hydroxy: 26.6 ng/mL — ABNORMAL LOW (ref 30.0–100.0)

## 2015-10-16 LAB — LIPID PANEL
CHOL/HDL RATIO: 2.3 ratio (ref 0.0–4.4)
CHOLESTEROL TOTAL: 223 mg/dL — AB (ref 100–199)
HDL: 96 mg/dL (ref >39)
LDL Calculated: 115 mg/dL — ABNORMAL HIGH (ref 0–99)
TRIGLYCERIDES: 61 mg/dL (ref 0–149)
VLDL Cholesterol Cal: 12 mg/dL (ref 5–40)

## 2015-10-16 LAB — TSH: TSH: 0.992 u[IU]/mL (ref 0.450–4.500)

## 2015-10-16 LAB — HEMOGLOBIN A1C
Est. average glucose Bld gHb Est-mCnc: 103 mg/dL
HEMOGLOBIN A1C: 5.2 % (ref 4.8–5.6)

## 2015-10-16 LAB — GLUCOSE, RANDOM: GLUCOSE: 88 mg/dL (ref 65–99)

## 2016-05-19 ENCOUNTER — Encounter: Payer: Self-pay | Admitting: General Surgery

## 2016-05-19 ENCOUNTER — Ambulatory Visit (INDEPENDENT_AMBULATORY_CARE_PROVIDER_SITE_OTHER): Payer: BC Managed Care – PPO | Admitting: General Surgery

## 2016-05-19 VITALS — BP 156/90 | HR 88 | Resp 12 | Ht 66.0 in | Wt 135.0 lb

## 2016-05-19 DIAGNOSIS — R1032 Left lower quadrant pain: Secondary | ICD-10-CM | POA: Diagnosis not present

## 2016-05-19 NOTE — Patient Instructions (Addendum)
Patient to have a ct scan done    The patient is scheduled for a CT Scan abdomen pelvis with contrast at [facility] on 05/20/16 at 11:00 am. She will arrive by 10:45 am and have nothing to eat or drink for 4 hours prior.

## 2016-05-19 NOTE — Progress Notes (Signed)
Patient ID: Laurie Figueroa, female   DOB: 02-29-1955, 61 y.o.   MRN: [id number]  Chief Complaint  Patient presents with  . Follow-up    HPI Laurie Figueroa is a 61 y.o. female here today for evaluation of diverticulitis. Patient states she is having pain in her left lower quadrant.The pain has been going on for two weeks. Denies fever, nausea, vomtting, diarrhea. Bowel movements are regular with metamucil.  HPI  Past Medical History:  Diagnosis Date  . Breast screening, unspecified 2013  . Constipation   . Diffuse cystic mastopathy 2012  . Diverticulitis   . Special screening for malignant neoplasms, colon 2013    Past Surgical History:  Procedure Laterality Date  . BREAST BIOPSY Left    benign  . BUNIONECTOMY  2008  . COLONOSCOPY  2009   Dr. Jamal Collin  . COLONOSCOPY WITH PROPOFOL N/A 08/28/2015   Procedure: COLONOSCOPY WITH PROPOFOL;  Surgeon: Christene Lye, MD;  Location: [facility] ENDOSCOPY;  Service: Endoscopy;  Laterality: N/A;  . FOOT SURGERY  2017  . TOE SURGERY  2005    Family History  Problem Relation Age of Onset  . Cancer Mother     pancreatic cancer  . Breast cancer Maternal Aunt   . Diabetes Brother   . Heart disease Maternal Grandmother   . Colon cancer Brother     adenoczarcinoma  . Ovarian cancer Neg Hx     Social History Social History  Substance Use Topics  . Smoking status: Never Smoker  . Smokeless tobacco: Never Used  . Alcohol use 0.0 oz/week     Comment: ocassionally    Allergies  Allergen Reactions  . Codeine Nausea Only  . Penicillins Rash  . Sulfa Antibiotics Rash    No current outpatient prescriptions on file.   No current facility-administered medications for this visit.     Review of Systems Review of Systems  Constitutional: Negative.   Respiratory: Negative.   Cardiovascular: Negative.   Gastrointestinal: Positive for abdominal pain. Negative for diarrhea and nausea.    Blood pressure (!) 156/90, pulse 88, resp.  rate 12, height 5\' 6"  (1.676 m), weight 135 lb (61.2 kg).  Physical Exam Physical Exam  Constitutional: She is oriented to person, place, and time. She appears well-developed and well-nourished.  Eyes: Conjunctivae are normal. No scleral icterus.  Neck: Neck supple. No thyromegaly present.  Cardiovascular: Normal rate, regular rhythm and normal heart sounds.   Pulmonary/Chest: Effort normal and breath sounds normal.  Abdominal: Soft. Normal appearance and bowel sounds are normal. There is no hepatomegaly. There is tenderness in the left lower quadrant. No hernia.  Lymphadenopathy:    She has no cervical adenopathy.  Neurological: She is alert and oriented to person, place, and time.  Skin: Skin is warm and dry.    Data Reviewed Prior notes and colonoscopy reviewed - diverticulae throughout entire colon.   Assessment    LLQ abdominal pain, low grade, with no associated symptoms. Initial impression is mild diverticulitis.     Plan   Check CBC.  Patient to have a ct scan done  Subsequent management will be based on findings.     HPI, Physical Exam, Assessment and Plan have been scribed under the direction and in the presence of Mckinley Jewel, MD  Gaspar Cola, CMA  The patient is scheduled for a CT Scan abdomen pelvis with contrast at [facility] on 05/20/16 at 11:00 am. She will arrive by 10:45 am and have nothing  to eat or drink for 4 hours prior.  Documented by Lesly Rubenstein LPN   I have completed the exam and reviewed the above documentation for accuracy and completeness.  I agree with the above.  Haematologist has been used and any errors in dictation or transcription are unintentional.  Alarik Radu G. Jamal Collin, M.D., F.A.C.S. Junie Panning G 05/19/2016, 4:18 PM

## 2016-05-20 ENCOUNTER — Telehealth: Payer: Self-pay | Admitting: *Deleted

## 2016-05-20 ENCOUNTER — Ambulatory Visit
Admission: RE | Admit: 2016-05-20 | Discharge: 2016-05-20 | Disposition: A | Discharge status: Home / Self Care | Payer: BC Managed Care – PPO | Source: Ambulatory Visit | Attending: General Surgery | Admitting: General Surgery

## 2016-05-20 DIAGNOSIS — R1032 Left lower quadrant pain: Secondary | ICD-10-CM

## 2016-05-20 LAB — CBC WITH DIFFERENTIAL/PLATELET
BASOS: 0 %
Basophils Absolute: 0 10*3/uL (ref 0.0–0.2)
EOS (ABSOLUTE): 0.1 10*3/uL (ref 0.0–0.4)
EOS: 1 %
HEMATOCRIT: 43.3 % (ref 34.0–46.6)
HEMOGLOBIN: 14.2 g/dL (ref 11.1–15.9)
Immature Grans (Abs): 0 10*3/uL (ref 0.0–0.1)
Immature Granulocytes: 0 %
LYMPHS ABS: 2.5 10*3/uL (ref 0.7–3.1)
Lymphs: 34 %
MCH: 29.9 pg (ref 26.6–33.0)
MCHC: 32.8 g/dL (ref 31.5–35.7)
MCV: 91 fL (ref 79–97)
MONOCYTES: 12 %
Monocytes Absolute: 0.9 10*3/uL (ref 0.1–0.9)
NEUTROS ABS: 3.8 10*3/uL (ref 1.4–7.0)
Neutrophils: 53 %
Platelets: 223 10*3/uL (ref 150–379)
RBC: 4.75 x10E6/uL (ref 3.77–5.28)
RDW: 14.1 % (ref 12.3–15.4)
WBC: 7.3 10*3/uL (ref 3.4–10.8)

## 2016-05-20 MED ORDER — IOPAMIDOL (ISOVUE-300) INJECTION 61%
100.0000 mL | Freq: Once | INTRAVENOUS | Status: AC | PRN
  Administered 2016-05-20: 100 mL via INTRAVENOUS

## 2016-05-20 NOTE — Telephone Encounter (Signed)
Patient called wanted to know her results from her CT.

## 2016-06-08 ENCOUNTER — Other Ambulatory Visit: Payer: Self-pay

## 2016-06-08 DIAGNOSIS — Z1231 Encounter for screening mammogram for malignant neoplasm of breast: Secondary | ICD-10-CM

## 2016-08-06 ENCOUNTER — Ambulatory Visit
Admission: RE | Admit: 2016-08-06 | Discharge: 2016-08-06 | Disposition: A | Discharge status: Home / Self Care | Payer: BC Managed Care – PPO | Source: Ambulatory Visit | Attending: General Surgery | Admitting: General Surgery

## 2016-08-06 DIAGNOSIS — Z1231 Encounter for screening mammogram for malignant neoplasm of breast: Secondary | ICD-10-CM | POA: Diagnosis present

## 2016-08-20 ENCOUNTER — Encounter: Payer: Self-pay | Admitting: General Surgery

## 2016-08-20 ENCOUNTER — Ambulatory Visit (INDEPENDENT_AMBULATORY_CARE_PROVIDER_SITE_OTHER): Payer: BC Managed Care – PPO | Admitting: General Surgery

## 2016-08-20 VITALS — BP 114/80 | HR 76 | Resp 12 | Ht 65.0 in | Wt 138.0 lb

## 2016-08-20 DIAGNOSIS — N6012 Diffuse cystic mastopathy of left breast: Secondary | ICD-10-CM

## 2016-08-20 DIAGNOSIS — Z800 Family history of malignant neoplasm of digestive organs: Secondary | ICD-10-CM | POA: Diagnosis not present

## 2016-08-20 NOTE — Patient Instructions (Signed)
Colonoscopy due 2022 with Dr Bary Castilla. Patient will be asked to follow with Dr Enzo Bi for her annual bilateral screening mammogram.

## 2016-08-20 NOTE — Progress Notes (Signed)
Patient ID: Laurie Figueroa, female   DOB: [birth year], 61 y.o.   MRN: [id number]  Chief Complaint  Patient presents with  . Follow-up    mammogram    HPI Laurie Figueroa is a 61 y.o. female.  who presents for a breast evaluation. The most recent mammogram was done on 08-06-16.  Patient does perform regular self breast checks and gets regular mammograms done.  No new breast issues. Colonoscopy was 08-28-15.  HPI  Past Medical History:  Diagnosis Date  . Breast screening, unspecified 2013  . Constipation   . Diffuse cystic mastopathy 2012  . Diverticulitis   . Special screening for malignant neoplasms, colon 2013    Past Surgical History:  Procedure Laterality Date  . BREAST BIOPSY Left    benign  . BUNIONECTOMY  2008  . COLONOSCOPY  2009   Dr. Jamal Collin  . COLONOSCOPY WITH PROPOFOL N/A 08/28/2015   Procedure: COLONOSCOPY WITH PROPOFOL;  Surgeon: Christene Lye, MD;  Location: [facility] ENDOSCOPY;  Service: Endoscopy;  Laterality: N/A;  . FOOT SURGERY  2017  . TOE SURGERY  2005    Family History  Problem Relation Age of Onset  . Cancer Mother        pancreatic cancer  . Breast cancer Maternal Aunt   . Diabetes Brother   . Heart disease Maternal Grandmother   . Colon cancer Brother        adenocarcinoma  . Ovarian cancer Neg Hx     Social History Social History  Substance Use Topics  . Smoking status: Never Smoker  . Smokeless tobacco: Never Used  . Alcohol use 0.0 oz/week     Comment: ocassionally    Allergies  Allergen Reactions  . Codeine Nausea Only  . Penicillins Rash  . Sulfa Antibiotics Rash    No current outpatient prescriptions on file.   No current facility-administered medications for this visit.     Review of Systems Review of Systems  Constitutional: Negative.   Respiratory: Negative.   Cardiovascular: Negative.     Blood pressure 114/80, pulse 76, resp. rate 12, height 5\' 5"  (1.651 m), weight 138 lb (62.6 kg).  Physical  Exam Physical Exam  Constitutional: She is oriented to person, place, and time. She appears well-developed and well-nourished.  HENT:  Mouth/Throat: Oropharynx is clear and moist.  Eyes: Conjunctivae are normal. No scleral icterus.  Neck: Neck supple.  Cardiovascular: Normal rate, regular rhythm and normal heart sounds.   Pulmonary/Chest: Effort normal and breath sounds normal. Right breast exhibits no inverted nipple, no mass, no nipple discharge, no skin change and no tenderness. Left breast exhibits no inverted nipple, no mass, no nipple discharge, no skin change and no tenderness.  Abdominal: Soft.  Lymphadenopathy:    She has no cervical adenopathy.    She has no axillary adenopathy.  Neurological: She is alert and oriented to person, place, and time.  Skin: Skin is warm and dry.  Psychiatric: Her behavior is normal.    Data Reviewed Mammogram reviewed and stable.  Assessment    FCD Family history colon cancer    Plan    Colonoscopy due 2022 with Dr Bary Castilla. Patient will be asked to follow with Dr Enzo Bi for her annual bilateral screening mammogram.      HPI, Physical Exam, Assessment and Plan have been scribed under the direction and in the presence of Mckinley Jewel, MD Karie Fetch, RN I have completed the exam and reviewed the above documentation for accuracy and  completeness.  I agree with the above.  Haematologist has been used and any errors in dictation or transcription are unintentional.  Tirth Cothron G. Jamal Collin, M.D., F.A.C.S.   Junie Panning G 08/21/2016, 8:32 AM

## 2016-10-09 NOTE — Progress Notes (Signed)
ANNUAL PREVENTATIVE CARE GYN  ENCOUNTER NOTE  Subjective:       Laurie Figueroa is a 61 y.o. G15P3003 female here for a routine annual gynecologic exam.  Current complaints: 1.  Pain in left side x 4 months; Achiness and left lower quadrant, seems to get better after bowel movements.; Recent CT scan obtained by Dr. Jamal Collin was normal. Next colonoscopy is due in 2022. Due to Dr. Jamal Collin retiring, we will monitor breast disease with next mammogram being due at her next annual appointment  No significant vasomotor symptoms or vaginal atrophy symptoms. No postmenopausal bleeding. Chronic constipation is stable; Brother recently diagnosed with colon cancer; colonoscopy 2017 inpatient revealed external hemorrhoids and diverticulosis   Gynecologic History No LMP recorded. Patient is postmenopausal. Contraception: post menopausal status Last Pap: 10/2014 neg/neg. Results were: normal Last mammogram: 07/2016 birad 1. Results were: normal  Obstetric History OB History  Gravida Para Term Preterm AB Living  3 3 3     3   SAB TAB Ectopic Multiple Live Births               # Outcome Date GA Lbr Len/2nd Weight Sex Delivery Anes PTL Lv  3 Term 1994   7 lb 1.8 oz (3.225 kg) F Vag-Spont     2 Term 1991   7 lb 2.3 oz (3.239 kg) F Vag-Spont     1 Term 1988   7 lb 6.4 oz (3.357 kg) M Vag-Spont       Obstetric Comments  Age at first pregnancy-31  Age at first menstruation-12  Age at last period-53    Past Medical History:  Diagnosis Date  . Breast screening, unspecified 2013  . Constipation   . Diffuse cystic mastopathy 2012  . Diverticulitis   . Special screening for malignant neoplasms, colon 2013    Past Surgical History:  Procedure Laterality Date  . BREAST BIOPSY Left    benign  . BUNIONECTOMY  2008  . COLONOSCOPY  2009   Dr. Jamal Collin  . COLONOSCOPY WITH PROPOFOL N/A 08/28/2015   Procedure: COLONOSCOPY WITH PROPOFOL;  Surgeon: Christene Lye, MD;  Location: [facility] ENDOSCOPY;  Service:  Endoscopy;  Laterality: N/A;  . FOOT SURGERY  2017  . TOE SURGERY  2005    No current outpatient prescriptions on file prior to visit.   No current facility-administered medications on file prior to visit.     Allergies  Allergen Reactions  . Codeine Nausea Only  . Penicillins Rash  . Sulfa Antibiotics Rash    Social History   Social History  . Marital status: Married    Spouse name: N/A  . Number of children: N/A  . Years of education: N/A   Occupational History  . Not on file.   Social History Main Topics  . Smoking status: Never Smoker  . Smokeless tobacco: Never Used  . Alcohol use 0.0 oz/week     Comment: ocassionally  . Drug use: No  . Sexual activity: Yes    Birth control/ protection: Post-menopausal   Other Topics Concern  . Not on file   Social History Narrative  . No narrative on file    Family History  Problem Relation Age of Onset  . Cancer Mother        pancreatic cancer  . Breast cancer Maternal Aunt   . Diabetes Brother   . Heart disease Maternal Grandmother   . Colon cancer Brother        adenocarcinoma  . Ovarian  cancer Neg Hx     The following portions of the patient's history were reviewed and updated as appropriate: allergies, current medications, past family history, past medical history, past social history, past surgical history and problem list.  Review of Systems ROS  Objective:   BP 118/73   Pulse 69   Ht 5\' 5"  (1.651 m)   Wt 140 lb 12.8 oz (63.9 kg)   BMI 23.43 kg/m   CONSTITUTIONAL: Well-developed, well-nourished female in no acute distress.  PSYCHIATRIC: Normal mood and affect. Normal behavior. Normal judgment and thought content. : Alert and oriented to person, place, and time. Normal muscle tone coordination. No cranial nerve deficit noted. HENT:  Normocephalic, atraumatic, External right and left ear normal. Oropharynx is clear and moist EYES: Conjunctivae and EOM are normal. Pupils are equal, round,  and reactive to light. No scleral icterus.  NECK: Normal range of motion, supple, no masses.  Normal thyroid.  SKIN: Skin is warm and dry. No rash noted. Not diaphoretic. No erythema. No pallor. CARDIOVASCULAR: Normal heart rate noted, regular rhythm, no murmur. RESPIRATORY: Clear to auscultation bilaterally. Effort and breath sounds normal, no problems with respiration noted. BREASTS: Symmetric in size. No masses, skin changes, nipple drainage, or lymphadenopathy. ABDOMEN: Soft, normal bowel sounds, no distention noted.  No tenderness, rebound or guarding.  BLADDER: Normal PELVIC:  External Genitalia: Normal  BUS: Normal  Vagina: Moderate vaginal atrophy  Cervix: Normal; parous, no lesions  Uterus: Normal; midplane, normal size and shape, mobile  Adnexa: Normal; nonpalpable and nontender  RV: External Exam NormaI, No Rectal Masses and Normal Sphincter tone  MUSCULOSKELETAL: Normal range of motion. No tenderness.  No cyanosis, clubbing, or edema.  2+ distal pulses. LYMPHATIC: No Axillary, Supraclavicular, or Inguinal Adenopathy.    Assessment:   Annual gynecologic examination 61 y.o. Contraception: post menopausal status Normal BMI Problem List Items Addressed This Visit    Menopause   Vaginal atrophy   Family history of colon cancer    Other Visit Diagnoses    Well woman exam with routine gynecological exam    -  Primary   Screening for colon cancer        Family history Colon Cancer (brother  Plan:  Pap: due 2019 Mammogram:Already completed this year; we will begin screening due to Dr. Angie Fava retirement next annual exam. Stool Guaiac Testing:  Ordered Next colonoscopy will be due in 2022 Labs: vit d lipid a1c fbs tsh Routine preventative health maintenance measures emphasized: Exercise/Diet/Weight control, Tobacco Warnings and Alcohol/Substance use risks Calcium with vitamin D supplementation is encouraged Return to [place], CMA  Brayton Mars, MD  Note: This dictation was prepared with Dragon dictation along with smaller phrase technology. Any transcriptional errors that result from this process are unintentional.

## 2016-10-15 ENCOUNTER — Encounter: Payer: Self-pay | Admitting: Obstetrics and Gynecology

## 2016-10-15 ENCOUNTER — Ambulatory Visit (INDEPENDENT_AMBULATORY_CARE_PROVIDER_SITE_OTHER): Payer: BC Managed Care – PPO | Admitting: Obstetrics and Gynecology

## 2016-10-15 VITALS — BP 118/73 | HR 69 | Ht 65.0 in | Wt 140.8 lb

## 2016-10-15 DIAGNOSIS — Z780 Asymptomatic menopausal state: Secondary | ICD-10-CM

## 2016-10-15 DIAGNOSIS — N952 Postmenopausal atrophic vaginitis: Secondary | ICD-10-CM

## 2016-10-15 DIAGNOSIS — Z1231 Encounter for screening mammogram for malignant neoplasm of breast: Secondary | ICD-10-CM | POA: Diagnosis not present

## 2016-10-15 DIAGNOSIS — Z01419 Encounter for gynecological examination (general) (routine) without abnormal findings: Secondary | ICD-10-CM

## 2016-10-15 DIAGNOSIS — Z800 Family history of malignant neoplasm of digestive organs: Secondary | ICD-10-CM

## 2016-10-15 DIAGNOSIS — Z1239 Encounter for other screening for malignant neoplasm of breast: Secondary | ICD-10-CM

## 2016-10-15 DIAGNOSIS — Z1211 Encounter for screening for malignant neoplasm of colon: Secondary | ICD-10-CM

## 2016-10-15 NOTE — Patient Instructions (Addendum)
1. No Pap smear this year 2. Mammogram already completed. We will start ordering mammograms due to Dr. Jamal Collin retirement at next annual exam 3. Stool guaiac cards are given for colon cancer screening; next colonoscopy is due in 2022 4. Screening labs are ordered. 5. Continue with healthy eating and exercise 6. Calcium with vitamin D supplementation twice a day is encouraged 1200 mg/800 international units daily 7. Return in 1 year for annual exam   Health Maintenance for Postmenopausal Women Menopause is a normal process in which your reproductive ability comes to an end. This process happens gradually over a span of months to years, usually between the ages of 91 and 49. Menopause is complete when you have missed 12 consecutive menstrual periods. It is important to talk with your health care provider about some of the most common conditions that affect postmenopausal women, such as heart disease, cancer, and bone loss (osteoporosis). Adopting a healthy lifestyle and getting preventive care can help to promote your health and wellness. Those actions can also lower your chances of developing some of these common conditions. What should I know about menopause? During menopause, you may experience a number of symptoms, such as:  Moderate-to-severe hot flashes.  Night sweats.  Decrease in sex drive.  Mood swings.  Headaches.  Tiredness.  Irritability.  Memory problems.  Insomnia.  Choosing to treat or not to treat menopausal changes is an individual decision that you make with your health care provider. What should I know about hormone replacement therapy and supplements? Hormone therapy products are effective for treating symptoms that are associated with menopause, such as hot flashes and night sweats. Hormone replacement carries certain risks, especially as you become older. If you are thinking about using estrogen or estrogen with progestin treatments, discuss the benefits and  risks with your health care provider. What should I know about heart disease and stroke? Heart disease, heart attack, and stroke become more likely as you age. This may be due, in part, to the hormonal changes that your body experiences during menopause. These can affect how your body processes dietary fats, triglycerides, and cholesterol. Heart attack and stroke are both medical emergencies. There are many things that you can do to help prevent heart disease and stroke:  Have your blood pressure checked at least every 1-2 years. High blood pressure causes heart disease and increases the risk of stroke.  If you are 45-51 years old, ask your health care provider if you should take aspirin to prevent a heart attack or a stroke.  Do not use any tobacco products, including cigarettes, chewing tobacco, or electronic cigarettes. If you need help quitting, ask your health care provider.  It is important to eat a healthy diet and maintain a healthy weight. ? Be sure to include plenty of vegetables, fruits, low-fat dairy products, and lean protein. ? Avoid eating foods that are high in solid fats, added sugars, or salt (sodium).  Get regular exercise. This is one of the most important things that you can do for your health. ? Try to exercise for at least 150 minutes each week. The type of exercise that you do should increase your heart rate and make you sweat. This is known as moderate-intensity exercise. ? Try to do strengthening exercises at least twice each week. Do these in addition to the moderate-intensity exercise.  Know your numbers.Ask your health care provider to check your cholesterol and your blood glucose. Continue to have your blood tested as directed by your  health care provider.  What should I know about cancer screening? There are several types of cancer. Take the following steps to reduce your risk and to catch any cancer development as early as possible. Breast Cancer  Practice  breast self-awareness. ? This means understanding how your breasts normally appear and feel. ? It also means doing regular breast self-exams. Let your health care provider know about any changes, no matter how small.  If you are 78 or older, have a clinician do a breast exam (clinical breast exam or CBE) every year. Depending on your age, family history, and medical history, it may be recommended that you also have a yearly breast X-ray (mammogram).  If you have a family history of breast cancer, talk with your health care provider about genetic screening.  If you are at high risk for breast cancer, talk with your health care provider about having an MRI and a mammogram every year.  Breast cancer (BRCA) gene test is recommended for women who have family members with BRCA-related cancers. Results of the assessment will determine the need for genetic counseling and BRCA1 and for BRCA2 testing. BRCA-related cancers include these types: ? Breast. This occurs in males or females. ? Ovarian. ? Tubal. This may also be called fallopian tube cancer. ? Cancer of the abdominal or pelvic lining (peritoneal cancer). ? Prostate. ? Pancreatic.  Cervical, Uterine, and Ovarian Cancer Your health care provider may recommend that you be screened regularly for cancer of the pelvic organs. These include your ovaries, uterus, and vagina. This screening involves a pelvic exam, which includes checking for microscopic changes to the surface of your cervix (Pap test).  For women ages 21-65, health care providers may recommend a pelvic exam and a Pap test every three years. For women ages 45-65, they may recommend the Pap test and pelvic exam, combined with testing for human papilloma virus (HPV), every five years. Some types of HPV increase your risk of cervical cancer. Testing for HPV may also be done on women of any age who have unclear Pap test results.  Other health care providers may not recommend any screening  for nonpregnant women who are considered low risk for pelvic cancer and have no symptoms. Ask your health care provider if a screening pelvic exam is right for you.  If you have had past treatment for cervical cancer or a condition that could lead to cancer, you need Pap tests and screening for cancer for at least 20 years after your treatment. If Pap tests have been discontinued for you, your risk factors (such as having a new sexual partner) need to be reassessed to determine if you should start having screenings again. Some women have medical problems that increase the chance of getting cervical cancer. In these cases, your health care provider may recommend that you have screening and Pap tests more often.  If you have a family history of uterine cancer or ovarian cancer, talk with your health care provider about genetic screening.  If you have vaginal bleeding after reaching menopause, tell your health care provider.  There are currently no reliable tests available to screen for ovarian cancer.  Lung Cancer Lung cancer screening is recommended for adults 58-31 years old who are at high risk for lung cancer because of a history of smoking. A yearly low-dose CT scan of the lungs is recommended if you:  Currently smoke.  Have a history of at least 30 pack-years of smoking and you currently smoke or have  quit within the past 15 years. A pack-year is smoking an average of one pack of cigarettes per day for one year.  Yearly screening should:  Continue until it has been 15 years since you quit.  Stop if you develop a health problem that would prevent you from having lung cancer treatment.  Colorectal Cancer  This type of cancer can be detected and can often be prevented.  Routine colorectal cancer screening usually begins at age 51 and continues through age 71.  If you have risk factors for colon cancer, your health care provider may recommend that you be screened at an earlier age.  If  you have a family history of colorectal cancer, talk with your health care provider about genetic screening.  Your health care provider may also recommend using home test kits to check for hidden blood in your stool.  A small camera at the end of a tube can be used to examine your colon directly (sigmoidoscopy or colonoscopy). This is done to check for the earliest forms of colorectal cancer.  Direct examination of the colon should be repeated every 5-10 years until age 3. However, if early forms of precancerous polyps or small growths are found or if you have a family history or genetic risk for colorectal cancer, you may need to be screened more often.  Skin Cancer  Check your skin from head to toe regularly.  Monitor any moles. Be sure to tell your health care provider: ? About any new moles or changes in moles, especially if there is a change in a mole's shape or color. ? If you have a mole that is larger than the size of a pencil eraser.  If any of your family members has a history of skin cancer, especially at a young age, talk with your health care provider about genetic screening.  Always use sunscreen. Apply sunscreen liberally and repeatedly throughout the day.  Whenever you are outside, protect yourself by wearing long sleeves, pants, a wide-brimmed hat, and sunglasses.  What should I know about osteoporosis? Osteoporosis is a condition in which bone destruction happens more quickly than new bone creation. After menopause, you may be at an increased risk for osteoporosis. To help prevent osteoporosis or the bone fractures that can happen because of osteoporosis, the following is recommended:  If you are 66-61 years old, get at least 1,000 mg of calcium and at least 600 mg of vitamin D per day.  If you are older than age 53 but younger than age 52, get at least 1,200 mg of calcium and at least 600 mg of vitamin D per day.  If you are older than age 22, get at least 1,200 mg of  calcium and at least 800 mg of vitamin D per day.  Smoking and excessive alcohol intake increase the risk of osteoporosis. Eat foods that are rich in calcium and vitamin D, and do weight-bearing exercises several times each week as directed by your health care provider. What should I know about how menopause affects my mental health? Depression may occur at any age, but it is more common as you become older. Common symptoms of depression include:  Low or sad mood.  Changes in sleep patterns.  Changes in appetite or eating patterns.  Feeling an overall lack of motivation or enjoyment of activities that you previously enjoyed.  Frequent crying spells.  Talk with your health care provider if you think that you are experiencing depression. What should I know about  immunizations? It is important that you get and maintain your immunizations. These include:  Tetanus, diphtheria, and pertussis (Tdap) booster vaccine.  Influenza every year before the flu season begins.  Pneumonia vaccine.  Shingles vaccine.  Your health care provider may also recommend other immunizations. This information is not intended to replace advice given to you by your health care provider. Make sure you discuss any questions you have with your health care provider. Document Released: 03/20/2005 Document Revised: 08/16/2015 Document Reviewed: 10/30/2014 Elsevier Interactive Patient Education  2018 [employer].

## 2016-10-16 LAB — LIPID PANEL
CHOL/HDL RATIO: 2.1 ratio (ref 0.0–4.4)
Cholesterol, Total: 222 mg/dL — ABNORMAL HIGH (ref 100–199)
HDL: 106 mg/dL (ref >39)
LDL Calculated: 106 mg/dL — ABNORMAL HIGH (ref 0–99)
TRIGLYCERIDES: 49 mg/dL (ref 0–149)
VLDL CHOLESTEROL CAL: 10 mg/dL (ref 5–40)

## 2016-10-16 LAB — GLUCOSE, RANDOM: Glucose: 88 mg/dL (ref 65–99)

## 2016-10-16 LAB — HEMOGLOBIN A1C
Est. average glucose Bld gHb Est-mCnc: 105 mg/dL
Hgb A1c MFr Bld: 5.3 % (ref 4.8–5.6)

## 2016-10-16 LAB — TSH: TSH: 1.46 u[IU]/mL (ref 0.450–4.500)

## 2017-01-22 DIAGNOSIS — H40023 Open angle with borderline findings, high risk, bilateral: Secondary | ICD-10-CM | POA: Insufficient documentation

## 2017-02-13 LAB — FECAL OCCULT BLOOD, IMMUNOCHEMICAL

## 2017-02-15 ENCOUNTER — Telehealth: Payer: Self-pay | Admitting: Obstetrics and Gynecology

## 2017-02-15 DIAGNOSIS — K5900 Constipation, unspecified: Secondary | ICD-10-CM

## 2017-02-15 DIAGNOSIS — R109 Unspecified abdominal pain: Secondary | ICD-10-CM

## 2017-02-15 NOTE — Telephone Encounter (Signed)
The patient called and stated that she would like Dr. Tennis Must to put in a referral for her to see GI ([facility]) Provider name: Dr. Jeanne Ivan Fax # [phone] and contact # [phone]. The patient would like to speak with a nurse as well, and also to discuss some questions and concerns. Please advise.

## 2017-02-16 NOTE — Telephone Encounter (Signed)
Pt states she has been having lower abd pain x 1 week. NO n/v/d. Does have h/o of constipation. FH pos for colon(brother) and pancreatic(mom).  Want to see her and then make referral?

## 2017-02-18 NOTE — Telephone Encounter (Signed)
Per mad ok to make referral. Pt aware referral placed.

## 2017-03-25 DIAGNOSIS — Z8719 Personal history of other diseases of the digestive system: Secondary | ICD-10-CM | POA: Insufficient documentation

## 2017-03-25 DIAGNOSIS — R1032 Left lower quadrant pain: Secondary | ICD-10-CM | POA: Insufficient documentation

## 2017-03-25 DIAGNOSIS — K5900 Constipation, unspecified: Secondary | ICD-10-CM | POA: Insufficient documentation

## 2017-07-20 ENCOUNTER — Telehealth: Payer: Self-pay | Admitting: Obstetrics and Gynecology

## 2017-07-20 NOTE — Telephone Encounter (Signed)
Pt aware she can call an schedule her mammo on her own. It is due after 08/06/2017.

## 2017-07-20 NOTE — Telephone Encounter (Signed)
The patient called and stated that she would like to speak with a nurse in regards to her receiving a letter in regards to her needing a mammogram scheduled. The patient just wanted to confirm that she will not need one until she has her next physical. Please advise.

## 2017-08-12 IMAGING — CT CT ABD-PELV W/ CM
1 of 3 series · 14 of 32 positions shown, 19 images · IV contrast (APPLIED)
Comparison: None.

CLINICAL DATA: Left lower quadrant abdominal pain for 2 weeks.
Chronic constipation. Personal history of diverticulitis.

EXAM:
CT ABDOMEN AND PELVIS WITH CONTRAST
TECHNIQUE: Multidetector CT imaging of the abdomen and pelvis was performed
using the standard protocol following bolus administration of
intravenous contrast.
CONTRAST:  100mL 3DPH4X-233 IOPAMIDOL (3DPH4X-233) INJECTION 61%

[Series 2: axial st · axial · 0.70mm/px · z∈[-1019,-634]mm · 14 of 87 slices shown, 19 images]
[im 5/87  soft-tissue]
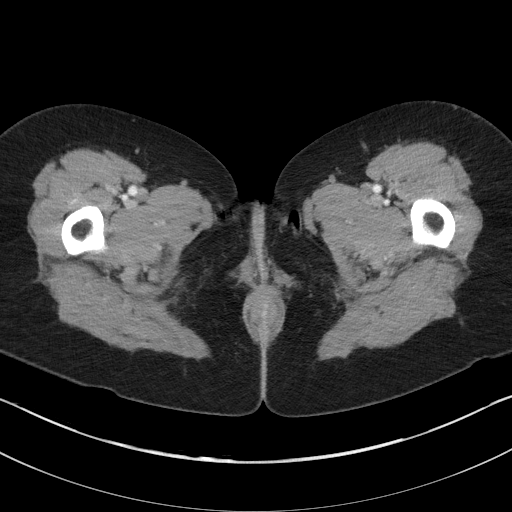
[im 5/87  bone]
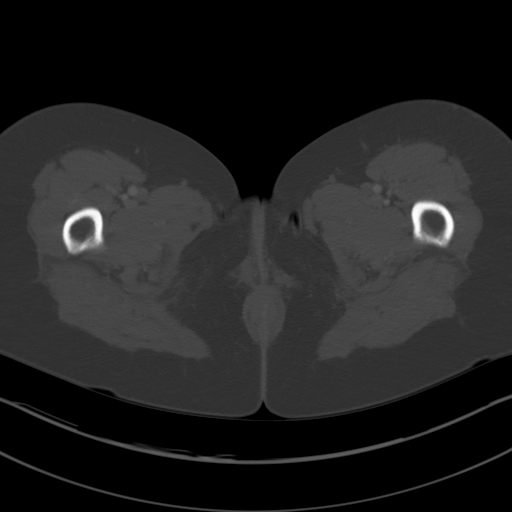
[im 10/87  soft-tissue]
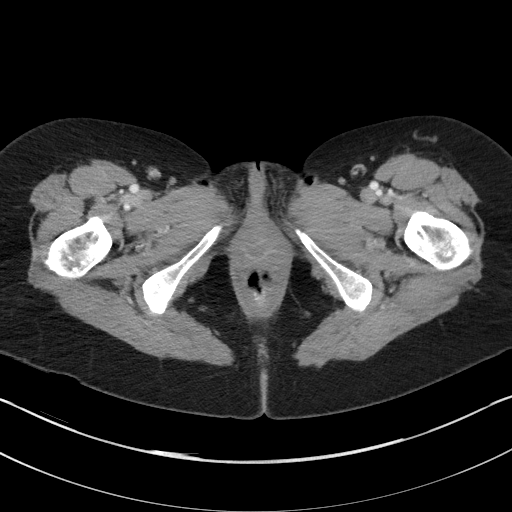
[im 20/87  soft-tissue]
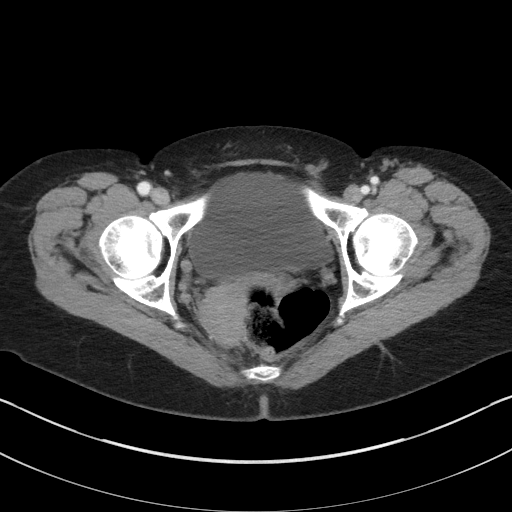
[im 24/87  soft-tissue]
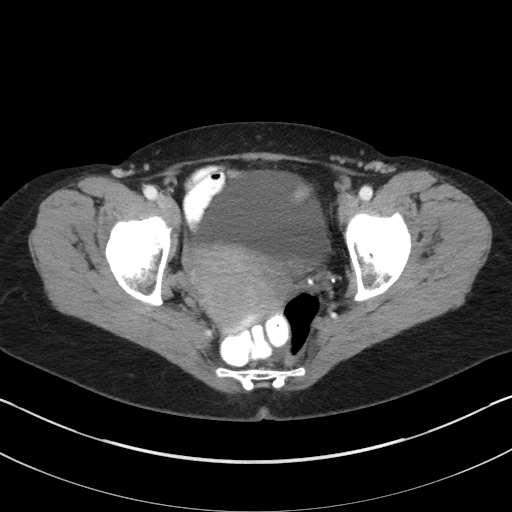
[im 29/87  soft-tissue]
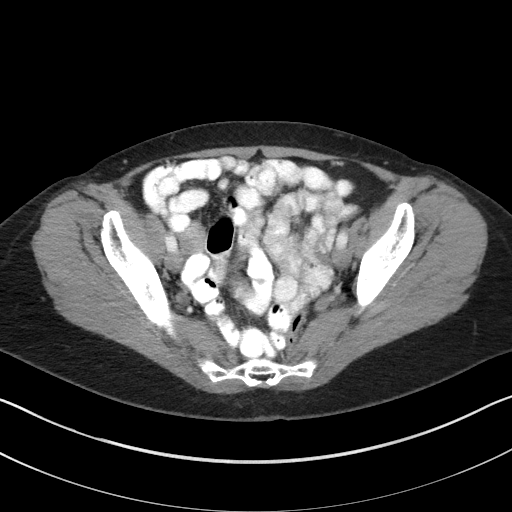
[im 39/87  soft-tissue]
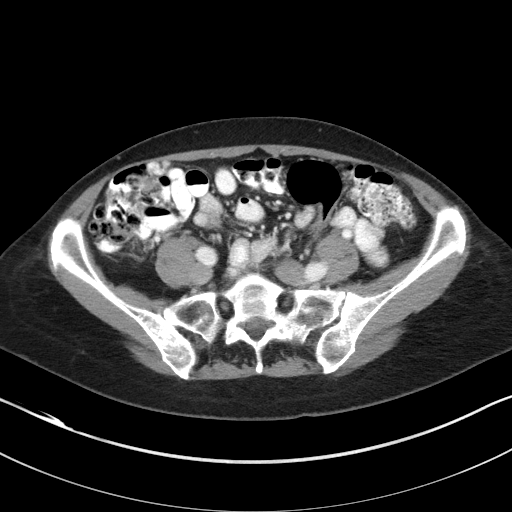
[im 44/87  soft-tissue]
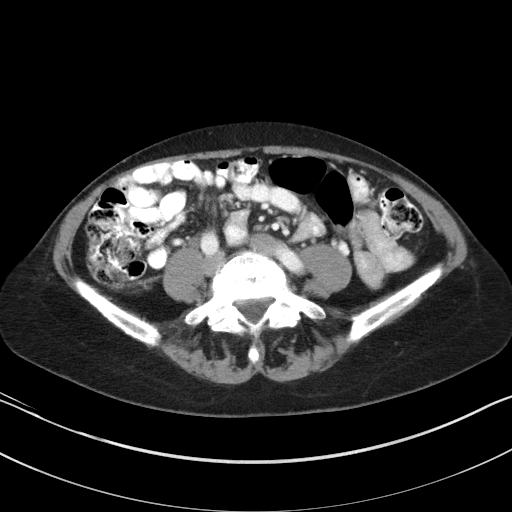
[im 48/87  soft-tissue]
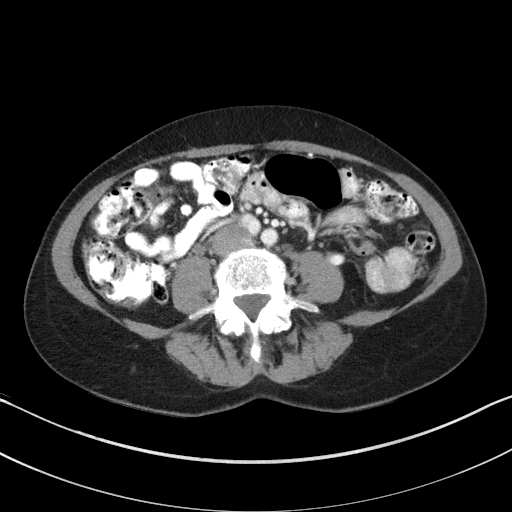
[im 58/87  soft-tissue]
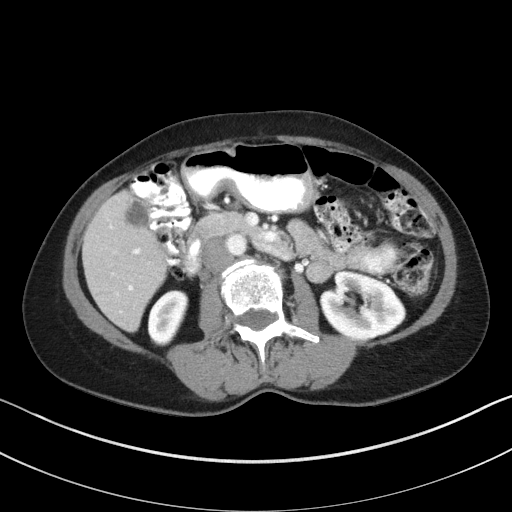
[im 58/87  bone]
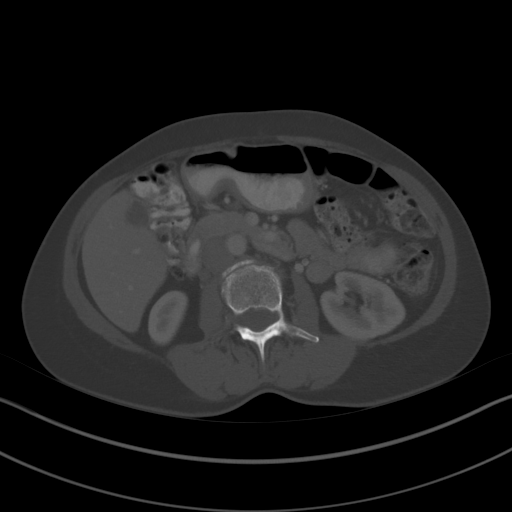
[im 63/87  soft-tissue]
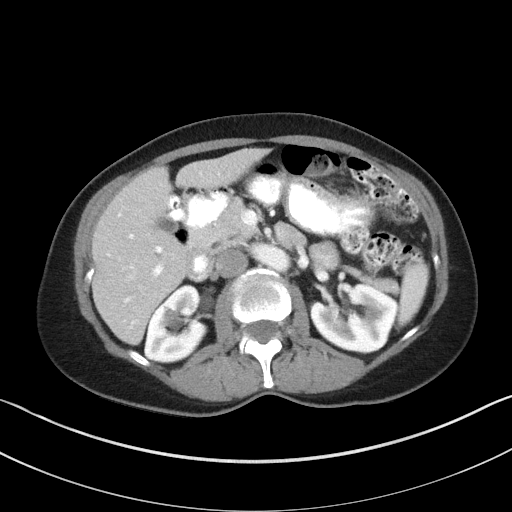
[im 67/87  soft-tissue]
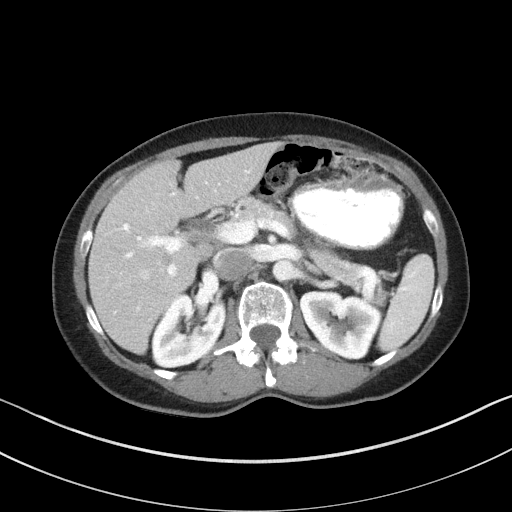
[im 67/87  lung]
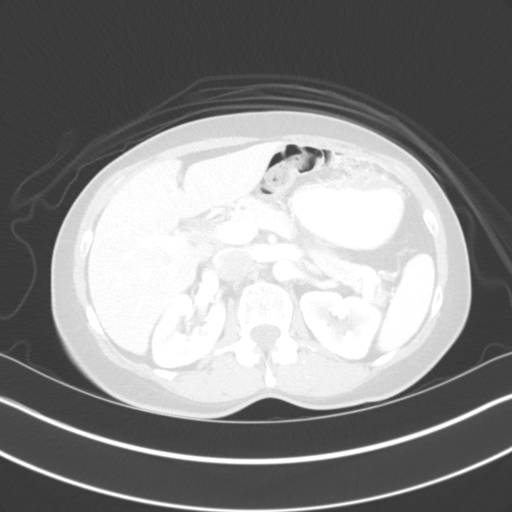
[im 72/87  lung]
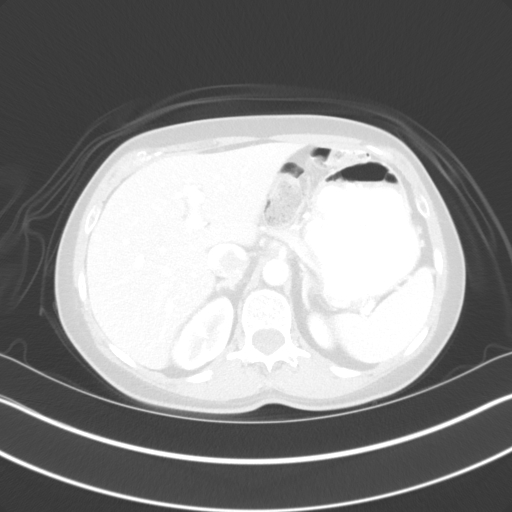
[im 77/87  soft-tissue]
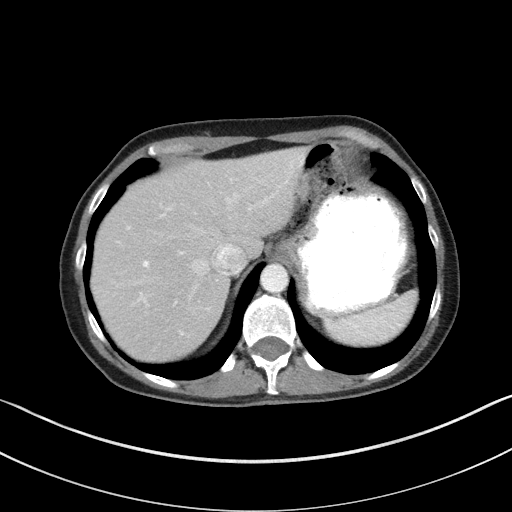
[im 77/87  lung]
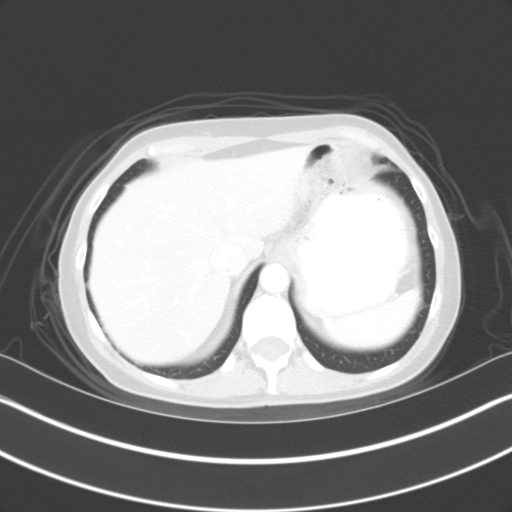
[im 82/87  soft-tissue]
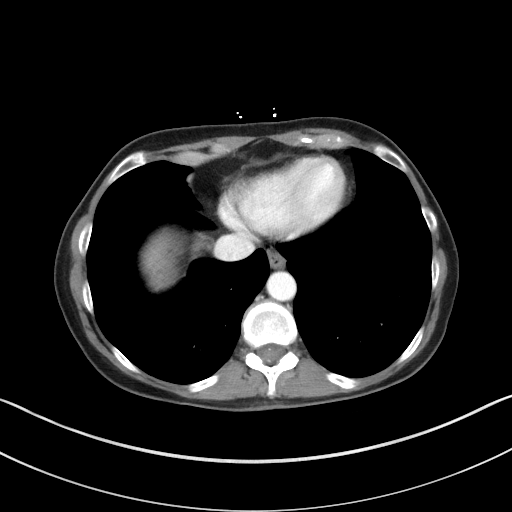
[im 82/87  lung]
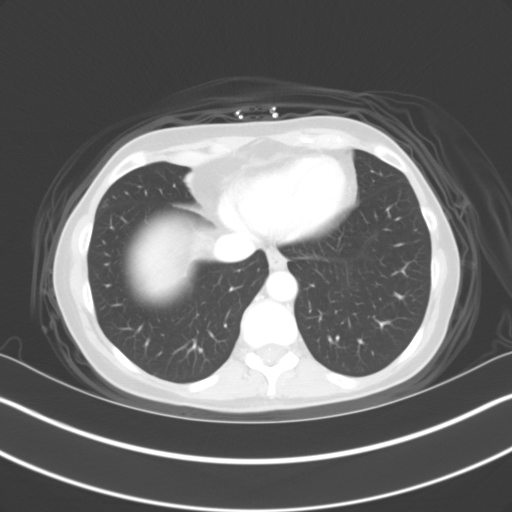

[14 of 32 positions shown; findings below may reference images not displayed]

FINDINGS: Lower chest: The lung bases are clear without focal nodule, mass, or
airspace disease.

Hepatobiliary: No focal liver abnormality is seen. No gallstones,
gallbladder wall thickening, or biliary dilatation.

Pancreas: Mild pancreatic duct dilation is present. No obstructing
lesion is evident. There are no focal mass lesions. No cystic
disease is present. There is no significant inflammation.

Spleen: Normal in size without focal abnormality.

Adrenals/Urinary Tract: The adrenal glands are normal bilaterally.
Kidneys and ureters are unremarkable. The urinary bladder is within
normal limits.

Stomach/Bowel: The stomach and duodenum are within normal limits.
Small bowel is unremarkable. The appendix is visualized and normal.
The ascending and transverse colon are within normal limits. The
descending colon is unremarkable. The sigmoid colon is mostly
collapsed. There is no focal inflammation.

Vascular/Lymphatic: No significant vascular findings are present. No
enlarged abdominal or pelvic lymph nodes.

Reproductive: Uterus and bilateral adnexa are unremarkable.

Other: No abdominal wall hernia or abnormality. No abdominopelvic
ascites.

Musculoskeletal: Mild rightward curvature is present in the lumbar
spine. Degenerative endplate changes are present at L5-S1. No focal
lytic or blastic lesions are present. Bony pelvis is intact.
IMPRESSION: 1. No acute or focal lesion to explain the patient's symptoms.
2. The sigmoid colon is mostly collapsed without inflammatory
change.
3. Degenerative changes of the lumbar spine are focused at L5-S1.

## 2017-08-18 ENCOUNTER — Ambulatory Visit
Admission: RE | Admit: 2017-08-18 | Discharge: 2017-08-18 | Disposition: A | Discharge status: Home / Self Care | Payer: BC Managed Care – PPO | Source: Ambulatory Visit | Attending: Obstetrics and Gynecology | Admitting: Obstetrics and Gynecology

## 2017-08-18 DIAGNOSIS — Z1231 Encounter for screening mammogram for malignant neoplasm of breast: Secondary | ICD-10-CM | POA: Diagnosis not present

## 2017-08-18 DIAGNOSIS — Z1239 Encounter for other screening for malignant neoplasm of breast: Secondary | ICD-10-CM

## 2017-10-21 ENCOUNTER — Encounter: Payer: BC Managed Care – PPO | Admitting: Obstetrics and Gynecology

## 2017-12-16 DIAGNOSIS — M542 Cervicalgia: Secondary | ICD-10-CM | POA: Insufficient documentation

## 2018-03-22 DIAGNOSIS — M7918 Myalgia, other site: Secondary | ICD-10-CM | POA: Insufficient documentation

## 2018-03-22 DIAGNOSIS — M47812 Spondylosis without myelopathy or radiculopathy, cervical region: Secondary | ICD-10-CM | POA: Insufficient documentation

## 2018-04-07 DIAGNOSIS — M47812 Spondylosis without myelopathy or radiculopathy, cervical region: Secondary | ICD-10-CM | POA: Insufficient documentation

## 2018-08-30 ENCOUNTER — Ambulatory Visit: Payer: BC Managed Care – PPO | Admitting: Podiatry

## 2018-08-30 ENCOUNTER — Other Ambulatory Visit: Payer: Self-pay | Admitting: Podiatry

## 2018-08-30 ENCOUNTER — Ambulatory Visit (INDEPENDENT_AMBULATORY_CARE_PROVIDER_SITE_OTHER): Payer: BC Managed Care – PPO

## 2018-08-30 ENCOUNTER — Encounter: Payer: Self-pay | Admitting: Podiatry

## 2018-08-30 ENCOUNTER — Other Ambulatory Visit: Payer: Self-pay

## 2018-08-30 VITALS — Temp 97.1°F

## 2018-08-30 DIAGNOSIS — M722 Plantar fascial fibromatosis: Secondary | ICD-10-CM

## 2018-08-30 DIAGNOSIS — M7752 Other enthesopathy of left foot: Secondary | ICD-10-CM

## 2018-08-30 MED ORDER — MELOXICAM 15 MG PO TABS: 15.0000 mg | ORAL_TABLET | Freq: Every day | ORAL | 3 refills | Status: DC

## 2018-08-30 MED ORDER — METHYLPREDNISOLONE 4 MG PO TBPK: ORAL_TABLET | ORAL | 0 refills | Status: DC

## 2018-08-30 NOTE — Patient Instructions (Signed)

## 2018-08-31 ENCOUNTER — Encounter: Payer: Self-pay | Admitting: Podiatry

## 2018-08-31 NOTE — Progress Notes (Signed)
She presents today chief complaint of pain to her left heel.  She states that is been aching after the past 2 to 3 weeks mornings are particularly bad she is tried Aleve Advil ice it helps temporarily.  Objective: Vital signs are stable she is alert and oriented x3.  Pulses are palpable.  She has pain on palpation mid calcaneal tubercle of her left heel radiographs taken today demonstrate retrocalcaneal region that appears to be normal however soft tissue increase in density at the plantar fascial calcaneal insertion site is indicative of plantar fasciitis.  Assessment: Plantar fasciitis left.  Plan: After sterile Betadine skin prep I injected 20 mg Kenalog 5 mg Marcaine point maximal tenderness left heel.  Start her on a Medrol Dosepak to be followed by meloxicam.  Plantar fascial brace and light splints dispensed as per discussed appropriate shoe gear stretching exercise ice therapy and shoe gear modification.

## 2018-09-28 ENCOUNTER — Ambulatory Visit: Payer: BC Managed Care – PPO | Admitting: Podiatry

## 2018-10-20 ENCOUNTER — Encounter: Payer: Self-pay | Admitting: Podiatry

## 2018-10-20 ENCOUNTER — Other Ambulatory Visit: Payer: Self-pay

## 2018-10-20 ENCOUNTER — Ambulatory Visit: Payer: BC Managed Care – PPO | Admitting: Podiatry

## 2018-10-20 DIAGNOSIS — M722 Plantar fascial fibromatosis: Secondary | ICD-10-CM | POA: Diagnosis not present

## 2018-10-20 DIAGNOSIS — M60872 Other myositis, left ankle and foot: Secondary | ICD-10-CM | POA: Diagnosis not present

## 2018-10-22 ENCOUNTER — Encounter: Payer: Self-pay | Admitting: Podiatry

## 2018-10-22 NOTE — Progress Notes (Signed)
She presents today states that the plantar fascial is feeling much better however she is started to develop pain over here as she points to the dorsal lateral aspect of the left foot.  Objective: Vital signs are stable alert and oriented x3.  Pulses are palpable.  Neurologic sensorium is intact.  Deep tendon reflexes are intact.  Muscle strength is normal symmetrical.  She no longer has pain on palpation medial calcaneal tubercle of the left heel.  She does however have pain on palpation of the fourth intermetatarsal space and the third intermetatarsal space mid diaphyseal region.  No pain on palpation of the metatarsals themselves.  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  Most likely secondary to compensatory problems.  Plan: After sterile Betadine skin prep I injected 10 mg of Kenalog directly into the point of maximal tenderness.  Remember to ask how her trip to [place] went.

## 2019-05-15 ENCOUNTER — Ambulatory Visit: Payer: BC Managed Care – PPO | Attending: Internal Medicine

## 2019-05-15 DIAGNOSIS — Z23 Encounter for immunization: Secondary | ICD-10-CM

## 2019-05-15 NOTE — Progress Notes (Signed)
   Covid-19 Vaccination Clinic  Name:  Laurie Figueroa    MRN: [id number] DOB: 1955-09-27  05/15/2019  [name] was observed post Covid-19 immunization for 15 minutes without incident. She was provided with Vaccine Information Sheet and instruction to access the V-Safe system.   [name] was instructed to call 911 with any severe reactions post vaccine: Marland Kitchen Difficulty breathing  . Swelling of face and throat  . A fast heartbeat  . A bad rash all over body  . Dizziness and weakness   Immunizations Administered    Name Date Dose VIS Date Route   Pfizer COVID-19 Vaccine 05/15/2019  8:28 AM 0.3 mL 01/20/2019 Intramuscular   Manufacturer: [place]   Lot: [id number]   [place]: [id number]

## 2019-06-07 ENCOUNTER — Ambulatory Visit: Payer: BC Managed Care – PPO | Attending: Internal Medicine

## 2019-06-07 DIAGNOSIS — Z23 Encounter for immunization: Secondary | ICD-10-CM

## 2019-06-07 NOTE — Progress Notes (Signed)
   Covid-19 Vaccination Clinic  Name:  Laurie Figueroa    MRN: [id number] DOB: 04/02/1955  06/07/2019  [name] was observed post Covid-19 immunization for 15 minutes without incident. She was provided with Vaccine Information Sheet and instruction to access the V-Safe system.   [name] was instructed to call 911 with any severe reactions post vaccine: Marland Kitchen Difficulty breathing  . Swelling of face and throat  . A fast heartbeat  . A bad rash all over body  . Dizziness and weakness   Immunizations Administered    Name Date Dose VIS Date Route   Pfizer COVID-19 Vaccine 06/07/2019  9:45 AM 0.3 mL 04/05/2018 Intramuscular   Manufacturer: [place]   Lot: [id number]   [place]: [id number]

## 2019-08-07 ENCOUNTER — Ambulatory Visit: Payer: BC Managed Care – PPO | Admitting: Podiatry

## 2020-03-21 ENCOUNTER — Encounter: Payer: BC Managed Care – PPO | Admitting: Dermatology

## 2020-06-10 ENCOUNTER — Other Ambulatory Visit: Payer: Self-pay

## 2020-06-10 ENCOUNTER — Ambulatory Visit: Payer: BC Managed Care – PPO | Admitting: Dermatology

## 2020-06-10 DIAGNOSIS — L818 Other specified disorders of pigmentation: Secondary | ICD-10-CM

## 2020-06-10 DIAGNOSIS — L820 Inflamed seborrheic keratosis: Secondary | ICD-10-CM

## 2020-06-10 DIAGNOSIS — Z1283 Encounter for screening for malignant neoplasm of skin: Secondary | ICD-10-CM

## 2020-06-10 DIAGNOSIS — L814 Other melanin hyperpigmentation: Secondary | ICD-10-CM

## 2020-06-10 DIAGNOSIS — D229 Melanocytic nevi, unspecified: Secondary | ICD-10-CM

## 2020-06-10 DIAGNOSIS — L309 Dermatitis, unspecified: Secondary | ICD-10-CM

## 2020-06-10 DIAGNOSIS — Z86018 Personal history of other benign neoplasm: Secondary | ICD-10-CM | POA: Diagnosis not present

## 2020-06-10 DIAGNOSIS — L578 Other skin changes due to chronic exposure to nonionizing radiation: Secondary | ICD-10-CM

## 2020-06-10 DIAGNOSIS — L84 Corns and callosities: Secondary | ICD-10-CM

## 2020-06-10 DIAGNOSIS — L821 Other seborrheic keratosis: Secondary | ICD-10-CM

## 2020-06-10 DIAGNOSIS — D1800 Hemangioma unspecified site: Secondary | ICD-10-CM

## 2020-06-10 MED ORDER — EUCRISA 2 % EX OINT: 1.0000 "application " | TOPICAL_OINTMENT | Freq: Two times a day (BID) | CUTANEOUS | 2 refills | Status: DC

## 2020-06-10 NOTE — Progress Notes (Signed)
Follow-Up Visit   Subjective  Laurie Figueroa is a 65 y.o. female who presents for the following: Annual Exam (History of dysplastic nevus - left prox forearm - TBSE today). The patient presents for Total-Body Skin Exam (TBSE) for skin cancer screening and mole check.  The following portions of the chart were reviewed this encounter and updated as appropriate:   Tobacco  Allergies  Meds  Problems  Med Hx  Surg Hx  Fam Hx     Review of Systems:  No other skin or systemic complaints except as noted in HPI or Assessment and Plan.  Objective  Well appearing patient in no apparent distress; mood and affect are within normal limits.  A full examination was performed including scalp, head, eyes, ears, nose, lips, neck, chest, axillae, abdomen, back, buttocks, bilateral upper extremities, bilateral lower extremities, hands, feet, fingers, toes, fingernails, and toenails. All findings within normal limits unless otherwise noted below.  Objective  Arms: Hypopigmented macules  Objective  Right Thumb Tip: Fissures  Objective  Left infraorbital: Erythematous keratotic or waxy stuck-on papule or plaque.    Assessment & Plan    Lentigines - Scattered tan macules - Due to sun exposure - Benign-appering, observe - Recommend daily broad spectrum sunscreen SPF 30+ to sun-exposed areas, reapply every 2 hours as needed. - Call for any changes  Seborrheic Keratoses - Stuck-on, waxy, tan-brown papules and/or plaques  - Benign-appearing - Discussed benign etiology and prognosis. - Observe - Call for any changes  Melanocytic Nevi - Tan-brown and/or pink-flesh-colored symmetric macules and papules - Benign appearing on exam today - Observation - Call clinic for new or changing moles - Recommend daily use of broad spectrum spf 30+ sunscreen to sun-exposed areas.   Hemangiomas - Red papules - Discussed benign nature - Observe - Call for any changes  Actinic Damage -  Chronic condition, secondary to cumulative UV/sun exposure - diffuse scaly erythematous macules with underlying dyspigmentation - Recommend daily broad spectrum sunscreen SPF 30+ to sun-exposed areas, reapply every 2 hours as needed.  - Staying in the shade or wearing long sleeves, sun glasses (UVA+UVB protection) and wide brim hats (4-inch brim around the entire circumference of the hat) are also recommended for sun protection.  - Call for new or changing lesions.  Skin cancer screening performed today.  Idiopathic guttate hypomelanosis Arms Benign.  Dermatitis Right Thumb Tip Hand deramtitis Eucrisa oint bid prn Atopic dermatitis (eczema) is a chronic, relapsing, pruritic condition that can significantly affect quality of life. It is often associated with allergic rhinitis and/or asthma and can require treatment with topical medications, phototherapy, or in severe cases a biologic medication called Dupixent in older children and adults.   Crisaborole (EUCRISA) 2 % OINT - Right Thumb Tip  Corns and callosities Left 1st-2nd Toe Web Recommend silicone toe separator to reduce rubbing  Inflamed seborrheic keratosis Left infraorbital Recheck on follow up  Destruction of lesion - Left infraorbital Complexity: simple   Destruction method: cryotherapy   Informed consent: discussed and consent obtained   Timeout:  patient name, date of birth, surgical site, and procedure verified Lesion destroyed using liquid nitrogen: Yes   Region frozen until ice ball extended beyond lesion: Yes   Outcome: patient tolerated procedure well with no complications   Post-procedure details: wound care instructions given    Return for 6 weeks for ISK follow up then 1 year with Dr Ileene Musa, CMA, am acting as scribe for Sarina Ser,  MD .  Documentation: I have reviewed the above documentation for accuracy and completeness, and I agree with the above.  Sarina Ser, MD

## 2020-06-10 NOTE — Patient Instructions (Signed)

## 2020-06-12 ENCOUNTER — Encounter: Payer: Self-pay | Admitting: Dermatology

## 2020-06-14 ENCOUNTER — Telehealth: Payer: Self-pay

## 2020-06-14 DIAGNOSIS — L309 Dermatitis, unspecified: Secondary | ICD-10-CM

## 2020-06-14 NOTE — Telephone Encounter (Signed)
Try sending Opzelura May want to send through [facility]. May need PA

## 2020-06-14 NOTE — Telephone Encounter (Signed)
Eucrisa not covered by insurance. What medication would you like to send instead?

## 2020-06-17 MED ORDER — OPZELURA 1.5 % EX CREA: 1.0000 "application " | TOPICAL_CREAM | Freq: Two times a day (BID) | CUTANEOUS | 0 refills | Status: DC

## 2020-06-17 NOTE — Telephone Encounter (Signed)
Patient informed that Opzelura sent to [facility] and to contact us if she doesn't hear from them in the next few days.

## 2020-07-24 ENCOUNTER — Other Ambulatory Visit: Payer: Self-pay

## 2020-07-24 ENCOUNTER — Ambulatory Visit: Payer: BC Managed Care – PPO | Admitting: Dermatology

## 2020-07-24 DIAGNOSIS — L309 Dermatitis, unspecified: Secondary | ICD-10-CM | POA: Diagnosis not present

## 2020-07-24 DIAGNOSIS — L821 Other seborrheic keratosis: Secondary | ICD-10-CM | POA: Diagnosis not present

## 2020-07-24 DIAGNOSIS — L820 Inflamed seborrheic keratosis: Secondary | ICD-10-CM

## 2020-07-24 DIAGNOSIS — D1800 Hemangioma unspecified site: Secondary | ICD-10-CM

## 2020-07-24 NOTE — Progress Notes (Signed)
   Follow-Up Visit   Subjective  Laurie Figueroa is a 65 y.o. female who presents for the following: recheck ISK  (L infraorbital - previously tx with LN2), skin lesion (On the back that is new - raised ), and hand dermatitis (R thumb has healed - she hasn't need to use the Opzelura yet, but has it at home for when she does).  The following portions of the chart were reviewed this encounter and updated as appropriate:   Tobacco  Allergies  Meds  Problems  Med Hx  Surg Hx  Fam Hx      Review of Systems:  No other skin or systemic complaints except as noted in HPI or Assessment and Plan.  Objective  Well appearing patient in no apparent distress; mood and affect are within normal limits.  A focused examination was performed including the face, back, and hands. Relevant physical exam findings are noted in the Assessment and Plan.  R thumb Clear today.  Back x 2 (2) Erythematous keratotic or waxy stuck-on papule or plaque.    Assessment & Plan  Hand dermatitis R thumb  Atopic dermatitis (eczema) is a chronic, relapsing, pruritic condition that can significantly affect quality of life. It is often associated with allergic rhinitis and/or asthma and can require treatment with topical medications, phototherapy, or in severe cases a biologic medication called Dupixent in older children and adults.   Continue Opzelura QD-BID PRN.   Related Medications Ruxolitinib Phosphate (OPZELURA) 1.5 % CREA Apply 1 application topically in the morning and at bedtime. Apply to aa's QD-BID PRN.  Inflamed seborrheic keratosis Back x 2  L infraorbital clear today   Destruction of lesion - Back x 2 Complexity: simple   Destruction method: cryotherapy   Informed consent: discussed and consent obtained   Timeout:  patient name, date of birth, surgical site, and procedure verified Lesion destroyed using liquid nitrogen: Yes   Region frozen until ice ball extended beyond lesion: Yes    Outcome: patient tolerated procedure well with no complications   Post-procedure details: wound care instructions given    Seborrheic Keratoses - Stuck-on, waxy, tan-brown papules and/or plaques  - Benign-appearing - Discussed benign etiology and prognosis. - Observe - Call for any changes  Hemangiomas - Red papules - Discussed benign nature - Observe - Call for any changes  Return for appointment as scheduled - TBSE .  Luther Redo, CMA, am acting as scribe for Sarina Ser, MD .  Documentation: I have reviewed the above documentation for accuracy and completeness, and I agree with the above.  Sarina Ser, MD

## 2020-07-24 NOTE — Patient Instructions (Signed)

## 2020-07-31 ENCOUNTER — Encounter: Payer: Self-pay | Admitting: Dermatology

## 2021-02-12 DIAGNOSIS — M25511 Pain in right shoulder: Secondary | ICD-10-CM | POA: Insufficient documentation

## 2021-02-12 DIAGNOSIS — S46011A Strain of muscle(s) and tendon(s) of the rotator cuff of right shoulder, initial encounter: Secondary | ICD-10-CM | POA: Insufficient documentation

## 2021-06-12 ENCOUNTER — Ambulatory Visit: Payer: Medicare PPO | Admitting: Dermatology

## 2021-06-12 DIAGNOSIS — Z1283 Encounter for screening for malignant neoplasm of skin: Secondary | ICD-10-CM | POA: Diagnosis not present

## 2021-06-12 DIAGNOSIS — L578 Other skin changes due to chronic exposure to nonionizing radiation: Secondary | ICD-10-CM

## 2021-06-12 DIAGNOSIS — L719 Rosacea, unspecified: Secondary | ICD-10-CM | POA: Diagnosis not present

## 2021-06-12 DIAGNOSIS — L821 Other seborrheic keratosis: Secondary | ICD-10-CM

## 2021-06-12 DIAGNOSIS — L709 Acne, unspecified: Secondary | ICD-10-CM | POA: Diagnosis not present

## 2021-06-12 DIAGNOSIS — D1800 Hemangioma unspecified site: Secondary | ICD-10-CM

## 2021-06-12 DIAGNOSIS — D229 Melanocytic nevi, unspecified: Secondary | ICD-10-CM

## 2021-06-12 DIAGNOSIS — L720 Epidermal cyst: Secondary | ICD-10-CM | POA: Diagnosis not present

## 2021-06-12 DIAGNOSIS — I8393 Asymptomatic varicose veins of bilateral lower extremities: Secondary | ICD-10-CM

## 2021-06-12 DIAGNOSIS — L814 Other melanin hyperpigmentation: Secondary | ICD-10-CM

## 2021-06-12 NOTE — Progress Notes (Signed)
Follow-Up Visit   Subjective  Laurie Figueroa is a 66 y.o. female who presents for the following: Annual Exam (Hx of dysplastic nevus. ). The patient presents for Total-Body Skin Exam (TBSE) for skin cancer screening and mole check.  The patient has spots, moles and lesions to be evaluated, some may be new or changing and the patient has concerns that these could be cancer. Patient does have a whitehead at right cheek that she would like treated.   The following portions of the chart were reviewed this encounter and updated as appropriate:   Tobacco  Allergies  Meds  Problems  Med Hx  Surg Hx  Fam Hx     Review of Systems:  No other skin or systemic complaints except as noted in HPI or Assessment and Plan.  Objective  Well appearing patient in no apparent distress; mood and affect are within normal limits.  A full examination was performed including scalp, head, eyes, ears, nose, lips, neck, chest, axillae, abdomen, back, buttocks, bilateral upper extremities, bilateral lower extremities, hands, feet, fingers, toes, fingernails, and toenails. All findings within normal limits unless otherwise noted below.  face Minimal pinkness at cheeks  right cheek Smooth white papule(s).   face [place]    Assessment & Plan  Rosacea face Rosacea is a chronic progressive skin condition usually affecting the face of adults, causing redness and/or acne bumps. It is treatable but not curable. It sometimes affects the eyes (ocular rosacea) as well. It may respond to topical and/or systemic medication and can flare with stress, sun exposure, alcohol, exercise and some foods.  Daily application of broad spectrum spf 30+ sunscreen to face is recommended to reduce flares.  Milia and Acne right cheek Samples x 2 of Aklief given to patient to use nightly as tolerated Lot # [id number]   Exp 10/24 Chronic and persistent condition with duration or expected duration over one year. Condition is  symptomatic / bothersome to patient. Not to goal.  Lentigines - Scattered tan macules - Due to sun exposure - Benign-appearing, observe - Recommend daily broad spectrum sunscreen SPF 30+ to sun-exposed areas, reapply every 2 hours as needed. - Call for any changes  Seborrheic Keratoses - Stuck-on, waxy, tan-brown papules and/or plaques  - Benign-appearing - Discussed benign etiology and prognosis. - Observe - Call for any changes  Melanocytic Nevi - Tan-brown and/or pink-flesh-colored symmetric macules and papules - Benign appearing on exam today - Observation - Call clinic for new or changing moles - Recommend daily use of broad spectrum spf 30+ sunscreen to sun-exposed areas.   Hemangiomas - Red papules - Discussed benign nature - Observe - Call for any changes  Actinic Damage - Chronic condition, secondary to cumulative UV/sun exposure - diffuse scaly erythematous macules with underlying dyspigmentation - Recommend daily broad spectrum sunscreen SPF 30+ to sun-exposed areas, reapply every 2 hours as needed.  - Staying in the shade or wearing long sleeves, sun glasses (UVA+UVB protection) and wide brim hats (4-inch brim around the entire circumference of the hat) are also recommended for sun protection.  - Call for new or changing lesions.  Skin cancer screening performed today.  Varicose Veins/Spider Veins - Dilated blue, purple or red veins at the lower extremities - Reassured - Smaller vessels can be treated by sclerotherapy (a procedure to inject a medicine into the veins to make them disappear) if desired, but the treatment is not covered by insurance. Larger vessels may be covered if symptomatic and we  would refer to vascular surgeon if treatment desired.  Return for TBSE 1-3 years.  Graciella Belton, RMA, am acting as scribe for Sarina Ser, MD . Documentation: I have reviewed the above documentation for accuracy and completeness, and I agree with the  above.  Sarina Ser, MD

## 2021-06-12 NOTE — Patient Instructions (Signed)
Melanoma ABCDEs ? ?Melanoma is the most dangerous type of skin cancer, and is the leading cause of death from skin disease.  You are more likely to develop melanoma if you: ?Have light-colored skin, light-colored eyes, or red or blond hair ?Spend a lot of time in the sun ?Tan regularly, either outdoors or in a tanning bed ?Have had blistering sunburns, especially during childhood ?Have a close family member who has had a melanoma ?Have atypical moles or large birthmarks ? ?Early detection of melanoma is key since treatment is typically straightforward and cure rates are extremely high if we catch it early.  ? ?The first sign of melanoma is often a change in a mole or a new dark spot.  The ABCDE system is a way of remembering the signs of melanoma. ? ?A for asymmetry:  The two halves do not match. ?B for border:  The edges of the growth are irregular. ?C for color:  A mixture of colors are present instead of an even brown color. ?D for diameter:  Melanomas are usually (but not always) greater than 90m - the size of a pencil eraser. ?E for evolution:  The spot keeps changing in size, shape, and color. ? ?Please check your skin once per month between visits. You can use a small mirror in front and a large mirror behind you to keep an eye on the back side or your body.  ? ?If you see any new or changing lesions before your next follow-up, please call to schedule a visit. ? ?Please continue daily skin protection including broad spectrum sunscreen SPF 30+ to sun-exposed areas, reapplying every 2 hours as needed when you're outdoors.   ? ?If You Need Anything After Your Visit ? ?If you have any questions or concerns for your doctor, please call our main line at [phone] press option 4 to reach your doctor's medical assistant. If no one answers, please leave a voicemail as directed and we will return your call as soon as possible. Messages left after 4 pm will be answered the following business day.  ? ?You may also  send [place] message via MyChart. We typically respond to MyChart messages within 1-2 business days. ? ?For prescription refills, please ask your pharmacy to contact our office. Our fax number is [phone] ? ?If you have an urgent issue when the clinic is closed that cannot wait until the next business day, you can page your doctor at the number below.   ? ?Please note that while we do our best to be available for urgent issues outside of office hours, we are not available 24/7.  ? ?If you have an urgent issue and are unable to reach [place] you may choose to seek medical care at your doctor's office, retail clinic, urgent care center, or emergency room. ? ?If you have a medical emergency, please immediately call 911 or go to the emergency department. ? ?Pager Numbers ? ?- Dr. KNehemiah Massed [phone]? ?- Dr. MLaurence Ferrari [phone]? ?- Dr. SNicole Kindred [phone]? ?In the event of inclement weather, please call our main line at [phone] an update on the status of any delays or closures. ? ?Dermatology Medication Tips: ?Please keep the boxes that topical medications come in in order to help keep track of the instructions about where and how to use these. Pharmacies typically print the medication instructions only on the boxes and not directly on the medication tubes.  ? ?If your medication is too expensive, please contact  our office at [phone] option 4 or send [place] a message through [place].  ? ?We are unable to tell what your co-pay for medications will be in advance as this is different depending on your insurance coverage. However, we may be able to find a substitute medication at lower cost or fill out paperwork to get insurance to cover a needed medication.  ? ?If a prior authorization is required to get your medication covered by your insurance company, please allow [place] 1-2 business days to complete this process. ? ?Drug prices often vary depending on where the prescription is filled and some pharmacies may  offer cheaper prices. ? ?The website www.goodrx.com contains coupons for medications through different pharmacies. The prices here do not account for what the cost may be with help from insurance (it may be cheaper with your insurance), but the website can give you the price if you did not use any insurance.  ?- You can print the associated coupon and take it with your prescription to the pharmacy.  ?- You may also stop by our office during regular business hours and pick up a GoodRx coupon card.  ?- If you need your prescription sent electronically to a different pharmacy, notify our office through [facility] or by phone at [phone] option 4. ? ? ? ? ?Si Usted Necesita Algo Despu?s de Su Visita ? ?Tambi?n puede enviarnos un mensaje a trav?s de MyChart. Por lo general respondemos a los mensajes de MyChart en el transcurso de 1 a 2 d?as h?biles. ? ?Para renovar recetas, por favor pida a su farmacia que se ponga en contacto con nuestra oficina. Nuestro n?mero de fax es el [phone]. ? ?Si tiene un asunto urgente cuando la cl?nica est? cerrada y que no puede esperar hasta el siguiente d?a h?bil, puede llamar/localizar a su doctor(a) al n?mero que aparece a continuaci?n.  ? ?Por favor, tenga en cuenta que aunque hacemos todo lo posible para estar disponibles para asuntos urgentes fuera del horario de oficina, no estamos disponibles las 24 horas del d?a, los 7 d?as de la semana.  ? ?Si tiene un problema urgente y no puede comunicarse con nosotros, puede optar por buscar atenci?n m?dica  en el consultorio de su doctor(a), en una cl?nica privada, en un centro de atenci?n urgente o en una sala de emergencias. ? ?Si tiene Engineer, maintenance (IT) m?dica, por favor llame inmediatamente al 911 o vaya a la sala de emergencias. ? ?N?meros de b?per ? ?- Dr. Nehemiah Massed: [phone] ? ?- Dra. Moye: [phone] ? ?- Dra. Nicole Kindred: [phone] ? ?En caso de inclemencias del tiempo, por favor llame a nuestra l?nea principal al  [phone] para una actualizaci?n sobre el estado de cualquier retraso o cierre. ? ?Consejos para la medicaci?n en dermatolog?a: ?Por favor, guarde las cajas en las que vienen los medicamentos de uso t?pico para ayudarle a seguir las instrucciones sobre d?nde y c?mo usarlos. Las farmacias generalmente imprimen las instrucciones del medicamento s?lo en las cajas y no directamente en los tubos del [place].  ? ?Si su medicamento es muy caro, por favor, p?ngase en contacto con Zigmund Daniel llamando al [phone] y presione la opci?n 4 o env?enos un mensaje a trav?s de MyChart.  ? ?No podemos decirle cu?l ser? su copago por los medicamentos por adelantado ya que esto es diferente dependiendo de la cobertura de su seguro. Sin embargo, es posible que podamos encontrar un medicamento sustituto a Electrical engineer un formulario para que el seguro cubra el medicamento  que se considera necesario.  ? ?Si se requiere Ardelia Mems autorizaci?n previa para que su compa??a de seguros [place] su medicamento, por favor perm?tanos de 1 a 2 d?as h?biles para completar este proceso. ? ?Los precios de los medicamentos var?an con frecuencia dependiendo del Environmental consultant de d?nde se surte la receta y alguna farmacias pueden ofrecer precios m?s baratos. ? ?El sitio web www.goodrx.com tiene cupones para medicamentos de Airline pilot. Los precios aqu? no tienen en cuenta lo que podr?a costar con la ayuda del seguro (puede ser m?s barato con su seguro), pero el sitio web puede darle el precio si no utiliz? ning?n seguro.  ?- Puede imprimir el cup?n correspondiente y llevarlo con su receta a la farmacia.  ?- Tambi?n puede pasar por nuestra oficina durante el horario de atenci?n regular y recoger una tarjeta de cupones de GoodRx.  ?- Si necesita que su receta se env?e electr?nicamente a Chiropodist, informe a nuestra oficina a trav?s de MyChart de  o por tel?fono llamando al [phone] y presione la opci?n 4. ? ?

## 2021-06-27 ENCOUNTER — Encounter: Payer: Self-pay | Admitting: Dermatology

## 2021-10-27 DIAGNOSIS — R030 Elevated blood-pressure reading, without diagnosis of hypertension: Secondary | ICD-10-CM | POA: Insufficient documentation

## 2021-12-22 DIAGNOSIS — Z4889 Encounter for other specified surgical aftercare: Secondary | ICD-10-CM | POA: Insufficient documentation

## 2021-12-22 DIAGNOSIS — Z9889 Other specified postprocedural states: Secondary | ICD-10-CM | POA: Insufficient documentation

## 2022-06-17 ENCOUNTER — Ambulatory Visit: Payer: Medicare PPO | Admitting: Dermatology

## 2022-06-17 VITALS — BP 142/91

## 2022-06-17 DIAGNOSIS — X32XXXA Exposure to sunlight, initial encounter: Secondary | ICD-10-CM

## 2022-06-17 DIAGNOSIS — L821 Other seborrheic keratosis: Secondary | ICD-10-CM | POA: Diagnosis not present

## 2022-06-17 DIAGNOSIS — D1801 Hemangioma of skin and subcutaneous tissue: Secondary | ICD-10-CM

## 2022-06-17 DIAGNOSIS — Z86018 Personal history of other benign neoplasm: Secondary | ICD-10-CM

## 2022-06-17 DIAGNOSIS — L814 Other melanin hyperpigmentation: Secondary | ICD-10-CM | POA: Diagnosis not present

## 2022-06-17 DIAGNOSIS — W908XXA Exposure to other nonionizing radiation, initial encounter: Secondary | ICD-10-CM

## 2022-06-17 DIAGNOSIS — L578 Other skin changes due to chronic exposure to nonionizing radiation: Secondary | ICD-10-CM

## 2022-06-17 DIAGNOSIS — Z1283 Encounter for screening for malignant neoplasm of skin: Secondary | ICD-10-CM

## 2022-06-17 NOTE — Patient Instructions (Signed)
Due to recent changes in healthcare laws, you may see results of your pathology and/or laboratory studies on MyChart before the doctors have had a chance to review them. We understand that in some cases there may be results that are confusing or concerning to you. Please understand that not all results are received at the same time and often the doctors may need to interpret multiple results in order to provide you with the best plan of care or course of treatment. Therefore, we ask that you please give us 2 business days to thoroughly review all your results before contacting the office for clarification. Should we see a critical lab result, you will be contacted sooner.   If You Need Anything After Your Visit  If you have any questions or concerns for your doctor, please call our main line at [phone] and press option 4 to reach your doctor's medical assistant. If no one answers, please leave a voicemail as directed and we will return your call as soon as possible. Messages left after 4 pm will be answered the following business day.   You may also send us a message via MyChart. We typically respond to MyChart messages within 1-2 business days.  For prescription refills, please ask your pharmacy to contact our office. Our fax number is [phone].  If you have an urgent issue when the clinic is closed that cannot wait until the next business day, you can page your doctor at the number below.    Please note that while we do our best to be available for urgent issues outside of office hours, we are not available 24/7.   If you have an urgent issue and are unable to reach us, you may choose to seek medical care at your doctor's office, retail clinic, urgent care center, or emergency room.  If you have a medical emergency, please immediately call 911 or go to the emergency department.  Pager Numbers  - Dr. Kowalski: [phone]  - Dr. Moye: [phone]  - Dr. Stewart:  [phone]  In the event of inclement weather, please call our main line at [phone] for an update on the status of any delays or closures.  Dermatology Medication Tips: Please keep the boxes that topical medications come in in order to help keep track of the instructions about where and how to use these. Pharmacies typically print the medication instructions only on the boxes and not directly on the medication tubes.   If your medication is too expensive, please contact our office at [phone] option 4 or send us a message through MyChart.   We are unable to tell what your co-pay for medications will be in advance as this is different depending on your insurance coverage. However, we may be able to find a substitute medication at lower cost or fill out paperwork to get insurance to cover a needed medication.   If a prior authorization is required to get your medication covered by your insurance company, please allow us 1-2 business days to complete this process.  Drug prices often vary depending on where the prescription is filled and some pharmacies may offer cheaper prices.  The website www.goodrx.com contains coupons for medications through different pharmacies. The prices here do not account for what the cost may be with help from insurance (it may be cheaper with your insurance), but the website can give you the price if you did not use any insurance.  - You can print the associated coupon and take it with   your prescription to the pharmacy.  - You may also stop by our office during regular business hours and pick up a GoodRx coupon card.  - If you need your prescription sent electronically to a different pharmacy, notify our office through [place] MyChart or by phone at [phone] option 4.     Si Usted Necesita Algo Despus de Su Visita  Tambin puede enviarnos un mensaje a travs de MyChart. Por lo general respondemos a los mensajes de MyChart en el transcurso de 1 a 2  das hbiles.  Para renovar recetas, por favor pida a su farmacia que se ponga en contacto con nuestra oficina. Nuestro nmero de fax es el [phone].  Si tiene un asunto urgente cuando la clnica est cerrada y que no puede esperar hasta el siguiente da hbil, puede llamar/localizar a su doctor(a) al nmero que aparece a continuacin.   Por favor, tenga en cuenta que aunque hacemos todo lo posible para estar disponibles para asuntos urgentes fuera del horario de oficina, no estamos disponibles las 24 horas del da, los 7 das de la semana.   Si tiene un problema urgente y no puede comunicarse con nosotros, puede optar por buscar atencin mdica  en el consultorio de su doctor(a), en una clnica privada, en un centro de atencin urgente o en una sala de emergencias.  Si tiene una emergencia mdica, por favor llame inmediatamente al 911 o vaya a la sala de emergencias.  Nmeros de bper  - Dr. Kowalski: [phone]  - Dra. Moye: [phone]  - Dra. Stewart: [phone]  En caso de inclemencias del tiempo, por favor llame a nuestra lnea principal al [phone] para una actualizacin sobre el estado de cualquier retraso o cierre.  Consejos para la medicacin en dermatologa: Por favor, guarde las cajas en las que vienen los medicamentos de uso tpico para ayudarle a seguir las instrucciones sobre dnde y cmo usarlos. Las farmacias generalmente imprimen las instrucciones del medicamento slo en las cajas y no directamente en los tubos del medicamento.   Si su medicamento es muy caro, por favor, pngase en contacto con nuestra oficina llamando al [phone] y presione la opcin 4 o envenos un mensaje a travs de MyChart.   No podemos decirle cul ser su copago por los medicamentos por adelantado ya que esto es diferente dependiendo de la cobertura de su seguro. Sin embargo, es posible que podamos encontrar un medicamento sustituto a menor costo o llenar un formulario para que el  seguro cubra el medicamento que se considera necesario.   Si se requiere una autorizacin previa para que su compaa de seguros cubra su medicamento, por favor permtanos de 1 a 2 das hbiles para completar este proceso.  Los precios de los medicamentos varan con frecuencia dependiendo del lugar de dnde se surte la receta y alguna farmacias pueden ofrecer precios ms baratos.  El sitio web www.goodrx.com tiene cupones para medicamentos de diferentes farmacias. Los precios aqu no tienen en cuenta lo que podra costar con la ayuda del seguro (puede ser ms barato con su seguro), pero el sitio web puede darle el precio si no utiliz ningn seguro.  - Puede imprimir el cupn correspondiente y llevarlo con su receta a la farmacia.  - Tambin puede pasar por nuestra oficina durante el horario de atencin regular y recoger una tarjeta de cupones de GoodRx.  - Si necesita que su receta se enve electrnicamente a una farmacia diferente, informe a nuestra oficina a travs de MyChart de [place]   o por telfono llamando al [phone] y presione la opcin 4.  

## 2022-06-17 NOTE — Progress Notes (Signed)
   Follow-Up Visit   Subjective  Laurie Figueroa is a 67 y.o. female who presents for the following: Skin Cancer Screening and Full Body Skin Exam  The patient presents for Total-Body Skin Exam (TBSE) for skin cancer screening and mole check. The patient has spots, moles and lesions to be evaluated, some may be new or changing and the patient has concerns that these could be cancer.    The following portions of the chart were reviewed this encounter and updated as appropriate: medications, allergies, medical history  Review of Systems:  No other skin or systemic complaints except as noted in HPI or Assessment and Plan.  Objective  Well appearing patient in no apparent distress; mood and affect are within normal limits.  A full examination was performed including scalp, head, eyes, ears, nose, lips, neck, chest, axillae, abdomen, back, buttocks, bilateral upper extremities, bilateral lower extremities, hands, feet, fingers, toes, fingernails, and toenails. All findings within normal limits unless otherwise noted below.   Relevant physical exam findings are noted in the Assessment and Plan.    Assessment & Plan   History of Dysplastic Nevi - No evidence of recurrence today - Recommend regular full body skin exams - Recommend daily broad spectrum sunscreen SPF 30+ to sun-exposed areas, reapply every 2 hours as needed.  - Call if any new or changing lesions are noted between office visits   LENTIGINES, SEBORRHEIC KERATOSES, HEMANGIOMAS - Benign normal skin lesions - Benign-appearing - Call for any changes  MELANOCYTIC NEVI - Tan-brown and/or pink-flesh-colored symmetric macules and papules - Benign appearing on exam today - Observation - Call clinic for new or changing moles - Recommend daily use of broad spectrum spf 30+ sunscreen to sun-exposed areas.   ACTINIC DAMAGE - Chronic condition, secondary to cumulative UV/sun exposure - diffuse scaly erythematous macules with  underlying dyspigmentation - Recommend daily broad spectrum sunscreen SPF 30+ to sun-exposed areas, reapply every 2 hours as needed.  - Staying in the shade or wearing long sleeves, sun glasses (UVA+UVB protection) and wide brim hats (4-inch brim around the entire circumference of the hat) are also recommended for sun protection.  - Call for new or changing lesions.  SKIN CANCER SCREENING PERFORMED TODAY.     Return for 1-3 years for TBSE.  I, Joanie Coddington, CMA, am acting as scribe for Armida Sans, MD .   Documentation: I have reviewed the above documentation for accuracy and completeness, and I agree with the above.  Armida Sans, MD

## 2022-06-23 ENCOUNTER — Encounter: Payer: Self-pay | Admitting: Dermatology

## 2023-01-25 ENCOUNTER — Encounter: Payer: Self-pay | Admitting: Podiatry

## 2023-01-25 ENCOUNTER — Ambulatory Visit: Payer: BC Managed Care – PPO | Admitting: Podiatry

## 2023-01-25 DIAGNOSIS — D2371 Other benign neoplasm of skin of right lower limb, including hip: Secondary | ICD-10-CM | POA: Diagnosis not present

## 2023-01-25 NOTE — Progress Notes (Signed)
Subjective:  Patient ID: Laurie Figueroa, female    DOB: [birth year],  MRN: [id number] HPI Chief Complaint  Patient presents with   Callouses    Plantar forefoot right - small, callused area x 6  months, tender now with walking   New Patient (Initial Visit)    Est pt 2020    67 y.o. female presents with the above complaint.   ROS: Denies fever chills nausea vomit muscle aches pains calf pain back pain chest pain shortness of breath.  Past Medical History:  Diagnosis Date   Breast screening, unspecified 2013   Constipation    Diffuse cystic mastopathy 2012   Diverticulitis    Dysplastic nevus 01/31/2013   left proximal forearm, mild atypia    Special screening for malignant neoplasms, colon 2013   Past Surgical History:  Procedure Laterality Date   BREAST EXCISIONAL BIOPSY Left    benign   BUNIONECTOMY  2008   COLONOSCOPY  2009   Dr. Evette Cristal   COLONOSCOPY WITH PROPOFOL N/A 08/28/2015   Procedure: COLONOSCOPY WITH PROPOFOL;  Surgeon: Kieth Brightly, MD;  Location: [facility] END[facility];  Service: Endoscopy;  Laterality: N/A;   FOOT SURGERY  2017   TOE SURGERY  2005    Current Outpatient Medications:    Azelastine HCl 137 MCG/SPRAY SOLN, Place 1 spray into both nostrils 2 (two) times daily., Disp: , Rfl:    metoprolol tartrate (LOPRESSOR) 25 MG tablet, Take by mouth., Disp: , Rfl:    Crisaborole (EUCRISA) 2 % OINT, Apply 1 application topically 2 (two) times daily. (Patient not taking: Reported on 07/24/2020), Disp: 60 g, Rfl: 2   fluticasone (FLONASE) 50 MCG/ACT nasal spray, SPRAY 2 SPRAYS INTO EACH NOSTRIL EVERY DAY, Disp: , Rfl:    hyoscyamine (LEVSIN SL) 0.125 MG SL tablet, PLACE 1 TABLET (0.125 MG TOTAL) UNDER THE TONGUE EVERY 4 (FOUR) HOURS AS NEEDED FOR CRAMPING, Disp: , Rfl:    meloxicam (MOBIC) 15 MG tablet, Take 1 tablet (15 mg total) by mouth daily., Disp: 30 tablet, Rfl: 3   Ruxolitinib Phosphate (OPZELURA) 1.5 % CREA, Apply 1 application topically in the morning  and at bedtime. Apply to aa's QD-BID PRN. (Patient not taking: Reported on 07/24/2020), Disp: 60 g, Rfl: 0  Allergies  Allergen Reactions   Codeine Nausea Only   Fentanyl Nausea Only   Penicillins Rash   Sulfa Antibiotics Rash   Review of Systems Objective:  There were no vitals filed for this visit.  General: Well developed, nourished, in no acute distress, alert and oriented x3   Dermatological: Skin is warm, dry and supple bilateral. Nails x 10 are well maintained; remaining integument appears unremarkable at this time. There are no open sores, no preulcerative lesions, no rash or signs of infection present.  She does however demonstrate a solitary porokeratotic lesion associated with fat atrophy beneath the fifth metatarsal head of the right foot.  Once enucleated it was confirmed to be porokeratotic in nature.  It is not verrucoid.  Vascular: Dorsalis Pedis artery and Posterior Tibial artery pedal pulses are 2/4 bilateral with immedate capillary fill time. Pedal hair growth present. No varicosities and no lower extremity edema present bilateral.   Neruologic: Grossly intact via light touch bilateral. Vibratory intact via tuning fork bilateral. Protective threshold with Semmes Wienstein monofilament intact to all pedal sites bilateral. Patellar and Achilles deep tendon reflexes 2+ bilateral. No Babinski or clonus noted bilateral.   Musculoskeletal: No gross boney pedal deformities bilateral. No pain, crepitus, or limitation  noted with foot and ankle range of motion bilateral. Muscular strength 5/5 in all groups tested bilateral.  Gait: Unassisted, Nonantalgic.    Radiographs:  None taken  Assessment & Plan:   Assessment: Benign skin lesion  Plan: Mechanical destruction followed by chemical destruction with Salinocaine to be left on for a day or so without getting wet.  She will follow-up with [place] on an as-needed basis     Chae Shuster T. [place], [place]

## 2023-01-27 ENCOUNTER — Encounter: Payer: Self-pay | Admitting: Podiatry

## 2023-06-28 ENCOUNTER — Encounter: Payer: Self-pay | Admitting: Podiatry

## 2023-06-28 ENCOUNTER — Ambulatory Visit: Admitting: Podiatry

## 2023-06-28 VITALS — Ht 65.0 in | Wt 140.8 lb

## 2023-06-28 DIAGNOSIS — M7752 Other enthesopathy of left foot: Secondary | ICD-10-CM

## 2023-06-28 DIAGNOSIS — D2372 Other benign neoplasm of skin of left lower limb, including hip: Secondary | ICD-10-CM

## 2023-06-28 MED ORDER — DEXAMETHASONE SODIUM PHOSPHATE 120 MG/30ML IJ SOLN
2.0000 mg | Freq: Once | INTRAMUSCULAR | Status: AC
Start: 2023-06-28 — End: 2023-06-28
  Administered 2023-06-28: 2 mg via INTRA_ARTICULAR

## 2023-06-28 NOTE — Progress Notes (Signed)
 She presents today concerned about a plantar wart to the plantar aspect of the fifth metatarsal head of the left foot.  States that has been there for quite a while and depending on how stepped on it it really hurts.  The area that also hurts is the spot on the medial aspect of the second digit as she points to the area.  She says this feels like a hard [place] in this area.  Objective: Vital signs are stable she is alert and oriented x 3.  Pulses are palpable.  There is no erythema edema cellulitis drainage odor she has pain on palpation.  She has bursitis medial aspect of the second digit left with overlying benign skin lesion.  She also has a similar finding beneath the fifth metatarsal head left foot.  Assessment benign skin lesion medial aspect second digit left plantar aspect fifth metatarsal head left and bursitis to these 2 areas.  Plan: I injected these areas today with dexamethasone  and local anesthetic debrided both benign skin lesions placed Salinocaine under occlusion to be left on 2 to 3 days without getting wet and then follow-up on an as-needed basis.
